# Patient Record
Sex: Female | Born: 1965 | Race: Black or African American | Hispanic: No | Marital: Married | Smoking: Never smoker
Health system: Southern US, Community
[De-identification: ages and names within clinical notes are randomized; demographics above are authoritative.]

## PROBLEM LIST (undated history)

## (undated) DIAGNOSIS — F419 Anxiety disorder, unspecified: Secondary | ICD-10-CM

## (undated) DIAGNOSIS — H409 Unspecified glaucoma: Secondary | ICD-10-CM

## (undated) DIAGNOSIS — J45909 Unspecified asthma, uncomplicated: Secondary | ICD-10-CM

## (undated) DIAGNOSIS — I10 Essential (primary) hypertension: Secondary | ICD-10-CM

## (undated) DIAGNOSIS — Z923 Personal history of irradiation: Secondary | ICD-10-CM

## (undated) DIAGNOSIS — J302 Other seasonal allergic rhinitis: Secondary | ICD-10-CM

## (undated) DIAGNOSIS — E785 Hyperlipidemia, unspecified: Secondary | ICD-10-CM

## (undated) DIAGNOSIS — E049 Nontoxic goiter, unspecified: Secondary | ICD-10-CM

## (undated) DIAGNOSIS — K219 Gastro-esophageal reflux disease without esophagitis: Secondary | ICD-10-CM

## (undated) DIAGNOSIS — D649 Anemia, unspecified: Secondary | ICD-10-CM

## (undated) HISTORY — DX: Gastro-esophageal reflux disease without esophagitis: K21.9

## (undated) HISTORY — DX: Unspecified asthma, uncomplicated: J45.909

## (undated) HISTORY — DX: Nontoxic goiter, unspecified: E04.9

## (undated) HISTORY — DX: Other seasonal allergic rhinitis: J30.2

## (undated) HISTORY — DX: Unspecified glaucoma: H40.9

## (undated) HISTORY — DX: Hyperlipidemia, unspecified: E78.5

## (undated) HISTORY — DX: Essential (primary) hypertension: I10

---

## 1999-04-16 HISTORY — PX: MENISCUS REPAIR: SHX5179

## 2010-04-15 HISTORY — PX: LAPAROSCOPIC CHOLECYSTECTOMY: SUR755

## 2020-06-05 DIAGNOSIS — M25511 Pain in right shoulder: Secondary | ICD-10-CM | POA: Diagnosis not present

## 2020-06-28 DIAGNOSIS — J4540 Moderate persistent asthma, uncomplicated: Secondary | ICD-10-CM | POA: Diagnosis not present

## 2020-06-28 DIAGNOSIS — E785 Hyperlipidemia, unspecified: Secondary | ICD-10-CM | POA: Diagnosis not present

## 2020-06-28 DIAGNOSIS — Z0000 Encounter for general adult medical examination without abnormal findings: Secondary | ICD-10-CM | POA: Diagnosis not present

## 2020-06-28 DIAGNOSIS — R718 Other abnormality of red blood cells: Secondary | ICD-10-CM | POA: Diagnosis not present

## 2020-06-28 DIAGNOSIS — E049 Nontoxic goiter, unspecified: Secondary | ICD-10-CM | POA: Diagnosis not present

## 2020-06-28 DIAGNOSIS — I10 Essential (primary) hypertension: Secondary | ICD-10-CM | POA: Diagnosis not present

## 2020-07-18 ENCOUNTER — Ambulatory Visit: Payer: Self-pay | Admitting: Cardiology

## 2020-08-18 ENCOUNTER — Ambulatory Visit: Payer: Self-pay | Admitting: Cardiology

## 2020-08-30 DIAGNOSIS — M25511 Pain in right shoulder: Secondary | ICD-10-CM | POA: Diagnosis not present

## 2020-09-04 NOTE — Progress Notes (Signed)
Cardiology Office Note   Date:  09/05/2020   ID:  Alexandra Archer, DOB [birth year], MRN [id number]  PCP:  Alexandra Roles, PA  Cardiologist:   None Referring:  Alexandra Roles, PA  Chief Complaint  Patient presents with  . Chest Pain      History of Present Illness: Alexandra Archer is a 55 y.o. female who presents for evaluation of shortness of breath.   She has a strong family history of CAD.  She has no prior cardiac history or work-up.  She does get short of breath.  She notices this after climbing a flight of stairs.  She can keep going but she thinks she is more short of breath than she should be and this is been slowly progressive.  She is not describing chest pressure, neck or arm discomfort.  She is not having any resting shortness of breath, PND or orthopnea.  She has no palpitations, presyncope or syncope.  Of note when she was living in [place] she was diagnosed with pleuritic pain and has had several bouts of the years managed with nonsteroidals.   Past Medical History:  Diagnosis Date  . Asthma   . GERD (gastroesophageal reflux disease)   . Glaucoma   . Goiter   . Hyperlipidemia   . Primary hypertension   . Seasonal allergic rhinitis     Past Surgical History:  Procedure Laterality Date  . CESAREAN SECTION  1994  . LAPAROSCOPIC CHOLECYSTECTOMY  2012  . MENISCUS REPAIR Right 2001     Current Outpatient Medications  Medication Sig Dispense Refill  . albuterol (VENTOLIN HFA) 108 (90 Base) MCG/ACT inhaler Inhale into the lungs every 4 (four) hours as needed for wheezing or shortness of breath.    Marland Kitchen amLODipine (NORVASC) 5 MG tablet Take 5 mg by mouth 2 (two) times daily. Take 1 tablet orally twice a day    . Ascorbic Acid (VITAMIN C) 100 MG tablet Take 100 mg by mouth daily.    . Cholecalciferol (VITAMIN D3) 50 MCG (2000 UT) CAPS Take by mouth.    . Cyanocobalamin (B-12) 100 MCG TABS Take by mouth.    . latanoprost (XALATAN) 0.005 % ophthalmic solution 1  drop at bedtime.    Marland Kitchen omeprazole (PRILOSEC) 40 MG capsule Take 40 mg by mouth daily.     No current facility-administered medications for this visit.    Allergies:   Patient has no known allergies.    Social History:  The patient  reports that she has never smoked. She has never used smokeless tobacco.   Her husband recently had a heart transplant at [facility] History:  The patient's family history includes Esophageal cancer in her mother; Heart attack (age of onset: 30) in her brother; Heart attack (age of onset: 88) in her father; Stomach cancer in her father.    ROS:  Please see the history of present illness.   Otherwise, review of systems are positive for none.   All other systems are reviewed and negative.    PHYSICAL EXAM: VS:  BP 124/80 (BP Location: Right Arm, Patient Position: Sitting, Cuff Size: Normal)   Pulse 70   Ht 5\' 7"  (1.702 m)   Wt 156 lb 12.8 oz (71.1 kg)   BMI 24.56 kg/m  , BMI Body mass index is 24.56 kg/m. GENERAL:  Well appearing HEENT:  Pupils equal round and reactive, fundi not visualized, oral mucosa unremarkable NECK:  No jugular venous distention, waveform within normal  limits, carotid upstroke brisk and symmetric, no bruits, no thyromegaly LYMPHATICS:  No cervical, inguinal adenopathy LUNGS:  Clear to auscultation bilaterally BACK:  No CVA tenderness CHEST:  Unremarkable HEART:  PMI not displaced or sustained,S1 and S2 within normal limits, no S3, no S4, no clicks, no rubs, very brief apical systolic murmur nonradiating, no diastolic murmurs ABD:  Flat, positive bowel sounds normal in frequency in pitch, no bruits, no rebound, no guarding, no midline pulsatile mass, no hepatomegaly, no splenomegaly EXT:  2 plus pulses throughout, no edema, no cyanosis no clubbing SKIN:  No rashes no nodules NEURO:  Cranial nerves II through XII grossly intact, motor grossly intact throughout PSYCH:  Cognitively intact, oriented to person place and time    EKG:   EKG is ordered today. The ekg ordered today demonstrates normal sinus rhythm, rate 70, axis within normal limits, intervals within normal limits, no acute ST-T wave changes.   Recent Labs: No results found for requested labs within last 8760 hours.    Lipid Panel No results found for: CHOL, TRIG, HDL, CHOLHDL, VLDL, LDLCALC, LDLDIRECT    Wt Readings from Last 3 Encounters:  09/05/20 156 lb 12.8 oz (71.1 kg)      Other studies Reviewed: Additional studies/ records that were reviewed today include: labs. Review of the above records demonstrates:  Please see elsewhere in the note.     ASSESSMENT AND PLAN:  SOB: The patient has a very strong family history of coronary disease and other risk factors including dyslipidemia as below.  I am going to screen her with a POET (Plain Old Exercise Treadmill) and a calcium score.  The calcium score will guide goals of therapy.  HTN: Her blood pressure is controlled.  No change in therapy.  LDL: Her LDL was 177 and total cholesterol 254.  Goals of therapy will be based on calcium score.  We did talk about plant-based diet.   Current medicines are reviewed at length with the patient today.  The patient does not have concerns regarding medicines.  The following changes have been made:  no change  Labs/ tests ordered today include:   Orders Placed This Encounter  Procedures  . CT CARDIAC SCORING (SELF PAY ONLY)  . EXERCISE TOLERANCE TEST (ETT)  . EKG 12-Lead     Disposition:   FU with me as needed and based on the results of the above.     Signed, Minus Breeding, MD  09/05/2020 5:20 PM    [facility]

## 2020-09-05 ENCOUNTER — Other Ambulatory Visit: Payer: Self-pay

## 2020-09-05 ENCOUNTER — Ambulatory Visit (INDEPENDENT_AMBULATORY_CARE_PROVIDER_SITE_OTHER): Payer: BC Managed Care – PPO | Admitting: Cardiology

## 2020-09-05 ENCOUNTER — Encounter: Payer: Self-pay | Admitting: Cardiology

## 2020-09-05 VITALS — BP 124/80 | HR 70 | Ht 67.0 in | Wt 156.8 lb

## 2020-09-05 DIAGNOSIS — R0600 Dyspnea, unspecified: Secondary | ICD-10-CM

## 2020-09-05 DIAGNOSIS — R0609 Other forms of dyspnea: Secondary | ICD-10-CM

## 2020-09-05 NOTE — Patient Instructions (Signed)
Medication Instructions:  No Changes In Medications at this time.  *If you need a refill on your cardiac medications before your next appointment, please call your pharmacy*  Testing/Procedures: Your physician has requested that you have an exercise tolerance test. For further information please visit [email or web address]. Please also follow instruction sheet, as given. This will take place at [address], [address].  Do not drink or eat foods with caffeine for 24 hours before the test. (Chocolate, coffee, tea, or energy drinks)  If you use an inhaler, bring it with you to the test.  Do not smoke for 4 hours before the test.  Wear comfortable shoes and clothing.  Dr. Percival Spanish has ordered a CT coronary calcium score. This test is done at [address]. This is $99 out of pocket.   Coronary CalciumScan A coronary calcium scan is an imaging test used to look for deposits of calcium and other fatty materials (plaques) in the inner lining of the blood vessels of the heart (coronary arteries). These deposits of calcium and plaques can partly clog and narrow the coronary arteries without producing any symptoms or warning signs. This puts a person at risk for a heart attack. This test can detect these deposits before symptoms develop. Tell a health care provider about:  Any allergies you have.  All medicines you are taking, including vitamins, herbs, eye drops, creams, and over-the-counter medicines.  Any problems you or family members have had with anesthetic medicines.  Any blood disorders you have.  Any surgeries you have had.  Any medical conditions you have.  Whether you are pregnant or may be pregnant. What are the risks? Generally, this is a safe procedure. However, problems may occur, including:  Harm to a pregnant woman and her unborn baby. This test involves the use of radiation. Radiation exposure can be dangerous to a pregnant woman and her unborn  baby. If you are pregnant, you generally should not have this procedure done.  Slight increase in the risk of cancer. This is because of the radiation involved in the test. What happens before the procedure? No preparation is needed for this procedure. What happens during the procedure?  You will undress and remove any jewelry around your neck or chest.  You will put on a hospital gown.  Sticky electrodes will be placed on your chest. The electrodes will be connected to an electrocardiogram (ECG) machine to record a tracing of the electrical activity of your heart.  A CT scanner will take pictures of your heart. During this time, you will be asked to lie still and hold your breath for 2-3 seconds while a picture of your heart is being taken. The procedure may vary among health care providers and hospitals. What happens after the procedure?  You can get dressed.  You can return to your normal activities.  It is up to you to get the results of your test. Ask your health care provider, or the department that is doing the test, when your results will be ready. Summary  A coronary calcium scan is an imaging test used to look for deposits of calcium and other fatty materials (plaques) in the inner lining of the blood vessels of the heart (coronary arteries).  Generally, this is a safe procedure. Tell your health care provider if you are pregnant or may be pregnant.  No preparation is needed for this procedure.  A CT scanner will take pictures of your heart.  You can return  to your normal activities after the scan is done. This information is not intended to replace advice given to you by your health care provider. Make sure you discuss any questions you have with your health care provider. Document Released: 09/28/2007 Document Revised: 02/19/2016 Document Reviewed: 02/19/2016 Elsevier Interactive Patient Education  2017 [place]: At [facility], you and your  health needs are our priority.  As part of our continuing mission to provide you with exceptional heart care, we have created designated Provider Care Teams.  These Care Teams include your primary Cardiologist (physician) and Advanced Practice Providers (APPs -  Physician Assistants and Nurse Practitioners) who all work together to provide you with the care you need, when you need it.  We recommend signing up for the patient portal called "MyChart".  Sign up information is provided on this After Visit Summary.  MyChart is used to connect with patients for Virtual Visits (Telemedicine).  Patients are able to view lab/test results, encounter notes, upcoming appointments, etc.  Non-urgent messages can be sent to your provider as well.   To learn more about what you can do with MyChart, go to [email or web address].    Your next appointment:   AS NEEDED   The format for your next appointment:   In Person  Provider:   Minus Breeding, MD

## 2020-09-12 ENCOUNTER — Encounter (HOSPITAL_COMMUNITY): Payer: BC Managed Care – PPO

## 2020-09-13 DIAGNOSIS — H401111 Primary open-angle glaucoma, right eye, mild stage: Secondary | ICD-10-CM | POA: Diagnosis not present

## 2020-09-13 DIAGNOSIS — H2513 Age-related nuclear cataract, bilateral: Secondary | ICD-10-CM | POA: Diagnosis not present

## 2020-09-13 DIAGNOSIS — H401122 Primary open-angle glaucoma, left eye, moderate stage: Secondary | ICD-10-CM | POA: Diagnosis not present

## 2020-09-16 DIAGNOSIS — Z20822 Contact with and (suspected) exposure to covid-19: Secondary | ICD-10-CM | POA: Diagnosis not present

## 2020-09-19 ENCOUNTER — Inpatient Hospital Stay (HOSPITAL_COMMUNITY): Admission: RE | Admit: 2020-09-19 | Payer: BC Managed Care – PPO | Source: Ambulatory Visit

## 2020-09-20 ENCOUNTER — Telehealth (HOSPITAL_COMMUNITY): Payer: Self-pay | Admitting: *Deleted

## 2020-09-20 NOTE — Telephone Encounter (Signed)
Close encounter 

## 2020-09-21 ENCOUNTER — Other Ambulatory Visit: Payer: Self-pay | Admitting: *Deleted

## 2020-09-21 ENCOUNTER — Other Ambulatory Visit: Payer: Self-pay

## 2020-09-21 ENCOUNTER — Ambulatory Visit (HOSPITAL_COMMUNITY)
Admission: RE | Admit: 2020-09-21 | Discharge: 2020-09-21 | Disposition: A | Discharge status: Home / Self Care | Payer: BC Managed Care – PPO | Source: Ambulatory Visit | Attending: Cardiovascular Disease | Admitting: Cardiovascular Disease

## 2020-09-21 DIAGNOSIS — R0600 Dyspnea, unspecified: Secondary | ICD-10-CM

## 2020-09-21 DIAGNOSIS — R0609 Other forms of dyspnea: Secondary | ICD-10-CM

## 2020-09-21 LAB — EXERCISE TOLERANCE TEST
Estimated workload: 7.8 METS
Exercise duration (min): 6 min
Exercise duration (sec): 31 s
MPHR: 167 {beats}/min
Peak HR: 169 {beats}/min
Percent HR: 101 %
Rest HR: 73 {beats}/min

## 2020-10-06 ENCOUNTER — Other Ambulatory Visit: Payer: Self-pay

## 2020-10-06 ENCOUNTER — Ambulatory Visit (INDEPENDENT_AMBULATORY_CARE_PROVIDER_SITE_OTHER)
Admission: RE | Admit: 2020-10-06 | Discharge: 2020-10-06 | Disposition: A | Discharge status: Home / Self Care | Payer: Self-pay | Source: Ambulatory Visit | Attending: Cardiology | Admitting: Cardiology

## 2020-10-06 DIAGNOSIS — R0600 Dyspnea, unspecified: Secondary | ICD-10-CM

## 2020-10-12 ENCOUNTER — Telehealth: Payer: Self-pay | Admitting: *Deleted

## 2020-10-12 DIAGNOSIS — R0609 Other forms of dyspnea: Secondary | ICD-10-CM

## 2020-10-12 NOTE — Telephone Encounter (Addendum)
-----   Message from Minus Breeding, MD sent at 10/11/2020  8:23 AM EDT ----- The calcium score is zero.  She had a negative POET (Plain Old Exercise Treadmill).  To evaluate SOB I would suggest PFTs.  Call Alexandra Archer with the results and send results to [name], [name], [place]   Spoke with pt, aware of the recommendations. Order placed for PFT's.

## 2020-11-01 DIAGNOSIS — R509 Fever, unspecified: Secondary | ICD-10-CM | POA: Diagnosis not present

## 2020-11-01 DIAGNOSIS — Z03818 Encounter for observation for suspected exposure to other biological agents ruled out: Secondary | ICD-10-CM | POA: Diagnosis not present

## 2020-11-01 DIAGNOSIS — R0982 Postnasal drip: Secondary | ICD-10-CM | POA: Diagnosis not present

## 2020-11-01 DIAGNOSIS — R059 Cough, unspecified: Secondary | ICD-10-CM | POA: Diagnosis not present

## 2020-11-17 ENCOUNTER — Encounter (HOSPITAL_COMMUNITY): Payer: BC Managed Care – PPO

## 2020-12-25 DIAGNOSIS — I10 Essential (primary) hypertension: Secondary | ICD-10-CM | POA: Diagnosis not present

## 2020-12-25 DIAGNOSIS — E785 Hyperlipidemia, unspecified: Secondary | ICD-10-CM | POA: Diagnosis not present

## 2020-12-25 DIAGNOSIS — H409 Unspecified glaucoma: Secondary | ICD-10-CM | POA: Diagnosis not present

## 2020-12-25 DIAGNOSIS — E538 Deficiency of other specified B group vitamins: Secondary | ICD-10-CM | POA: Diagnosis not present

## 2020-12-26 ENCOUNTER — Other Ambulatory Visit: Payer: Self-pay | Admitting: Cardiology

## 2020-12-26 LAB — SARS CORONAVIRUS 2 (TAT 6-24 HRS): SARS Coronavirus 2: NEGATIVE

## 2020-12-29 ENCOUNTER — Ambulatory Visit (HOSPITAL_COMMUNITY)
Admission: RE | Admit: 2020-12-29 | Discharge: 2020-12-29 | Disposition: A | Discharge status: Home / Self Care | Payer: BC Managed Care – PPO | Source: Ambulatory Visit | Attending: Cardiology | Admitting: Cardiology

## 2020-12-29 ENCOUNTER — Other Ambulatory Visit: Payer: Self-pay

## 2020-12-29 DIAGNOSIS — R0600 Dyspnea, unspecified: Secondary | ICD-10-CM | POA: Diagnosis not present

## 2020-12-29 DIAGNOSIS — R0609 Other forms of dyspnea: Secondary | ICD-10-CM

## 2020-12-29 LAB — PULMONARY FUNCTION TEST
DL/VA % pred: 137 %
DL/VA: 5.76 ml/min/mmHg/L
DLCO unc % pred: 82 %
DLCO unc: 18.87 ml/min/mmHg
FEF 25-75 Post: 1.89 L/sec
FEF 25-75 Pre: 1.56 L/sec
FEF2575-%Change-Post: 20 %
FEF2575-%Pred-Post: 73 %
FEF2575-%Pred-Pre: 61 %
FEV1-%Change-Post: 22 %
FEV1-%Pred-Post: 77 %
FEV1-%Pred-Pre: 63 %
FEV1-Post: 1.97 L
FEV1-Pre: 1.61 L
FEV1FVC-%Change-Post: 24 %
FEV1FVC-%Pred-Pre: 81 %
FEV6-%Change-Post: -1 %
FEV6-%Pred-Post: 77 %
FEV6-%Pred-Pre: 78 %
FEV6-Post: 2.4 L
FEV6-Pre: 2.44 L
FEV6FVC-%Pred-Post: 102 %
FEV6FVC-%Pred-Pre: 102 %
FVC-%Change-Post: -1 %
FVC-%Pred-Post: 75 %
FVC-%Pred-Pre: 76 %
FVC-Post: 2.4 L
FVC-Pre: 2.44 L
Post FEV1/FVC ratio: 82 %
Post FEV6/FVC ratio: 100 %
Pre FEV1/FVC ratio: 66 %
Pre FEV6/FVC Ratio: 100 %
RV % pred: 81 %
RV: 1.63 L
TLC % pred: 73 %
TLC: 4.06 L

## 2020-12-29 MED ORDER — ALBUTEROL SULFATE (2.5 MG/3ML) 0.083% IN NEBU
2.5000 mg | INHALATION_SOLUTION | Freq: Once | RESPIRATORY_TRACT | Status: AC
  Administered 2020-12-29: 2.5 mg via RESPIRATORY_TRACT

## 2021-01-01 ENCOUNTER — Other Ambulatory Visit: Payer: Self-pay

## 2021-01-01 DIAGNOSIS — R0600 Dyspnea, unspecified: Secondary | ICD-10-CM

## 2021-01-01 DIAGNOSIS — R0609 Other forms of dyspnea: Secondary | ICD-10-CM

## 2021-01-05 ENCOUNTER — Other Ambulatory Visit: Payer: Self-pay | Admitting: Physician Assistant

## 2021-01-05 DIAGNOSIS — Z1231 Encounter for screening mammogram for malignant neoplasm of breast: Secondary | ICD-10-CM

## 2021-01-25 ENCOUNTER — Ambulatory Visit
Admission: RE | Admit: 2021-01-25 | Discharge: 2021-01-25 | Disposition: A | Discharge status: Home / Self Care | Payer: BC Managed Care – PPO | Source: Ambulatory Visit

## 2021-01-25 ENCOUNTER — Other Ambulatory Visit: Payer: Self-pay

## 2021-01-25 DIAGNOSIS — Z1231 Encounter for screening mammogram for malignant neoplasm of breast: Secondary | ICD-10-CM

## 2021-01-29 ENCOUNTER — Telehealth: Payer: Self-pay | Admitting: Cardiology

## 2021-01-29 NOTE — Telephone Encounter (Signed)
Pt called stating she didn't fully understanding the results of her PFT test and was concern. Nurse explained results and advised MD wanted to refer her to pulmonologist for further evaluations. Pt verbalized understanding and state she already has an appointment schedule.

## 2021-01-29 NOTE — Telephone Encounter (Signed)
Patient calling to discuss test results

## 2021-02-01 ENCOUNTER — Other Ambulatory Visit: Payer: Self-pay | Admitting: Physician Assistant

## 2021-02-01 DIAGNOSIS — R928 Other abnormal and inconclusive findings on diagnostic imaging of breast: Secondary | ICD-10-CM

## 2021-02-01 DIAGNOSIS — M25511 Pain in right shoulder: Secondary | ICD-10-CM | POA: Diagnosis not present

## 2021-02-02 ENCOUNTER — Institutional Professional Consult (permissible substitution): Payer: Commercial Managed Care - PPO | Admitting: Student

## 2021-02-02 DIAGNOSIS — F419 Anxiety disorder, unspecified: Secondary | ICD-10-CM | POA: Diagnosis not present

## 2021-02-02 DIAGNOSIS — Z23 Encounter for immunization: Secondary | ICD-10-CM | POA: Diagnosis not present

## 2021-02-02 DIAGNOSIS — N951 Menopausal and female climacteric states: Secondary | ICD-10-CM | POA: Diagnosis not present

## 2021-02-14 NOTE — Progress Notes (Signed)
Synopsis: Referred for dyspnea on exertion, abnormal PFTs by Minus Breeding, MD  Subjective:   PATIENT ID: Alexandra Archer GENDER: female DOB: 1966-03-05, MRN: [id number]  Chief Complaint  Patient presents with   Consult    Pft was done in September and was referred. SOB: during exertion   54yF with history of asthma - never had it or eczema as a kid, AR but no sinus surgery, GERD who is referred for PFTs showing mild obstructive lung disease with remarkable BD response. FV loops raise possibility as well of upper airway obstruction which did seem less prominent with BD.  She says that she wakes up coughing all the time - not productive. Walking she has some DOE. Notices it after walking about a block. Some wheezing. Notices it sometimes when she's talking. She has some hoarseness which she relates to spraying lysol at office. No accompanying throat tightness when episodes happen. Saw an ENT in [place] for sinus issues many years ago - didn't do FNL exam she doesn't think.   She doesn't have sinonasal congestion, rhinorrhea. A little bit of post-nasal drip. She has no reflux currently on her omeprazole 40 mg which she takes first thing in the morning before eating.   Got a steroid shot for her shoulder recently and then prednisone course but she noticed maybe only a limited improvement.  Got covid-19 in 10/2020 - mild course. Not hospitalized. Took something specifically for covid.   She has tried following inhalers: Albuterol Arnuity - rinsed mouth after use. She noticed no effect.     Otherwise pertinent review of systems is negative.  Grandfather emphysema and aunt had COPD. Mother had esophageal cancer.  Just recently moved from [place] - was in [place]. [place] in [place]. Has lived previously in [place], [place]. No recent travel. 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 No exposure to dusts/solvents/particulates at home or at work. No water damage. She has a yorkie. Had a  cockatiel, got rid of it 4 ya she is unsure if she had some breathing issues back then. Never smoked. No vaping.  Past Medical History:  Diagnosis Date   Asthma    GERD (gastroesophageal reflux disease)    Glaucoma    Goiter    Hyperlipidemia    Primary hypertension    Seasonal allergic rhinitis      Family History  Problem Relation Age of Onset   Esophageal cancer Mother    Stomach cancer Father    Heart attack Father 26   Heart attack Brother 63     Past Surgical History:  Procedure Laterality Date   CESAREAN SECTION  1994   LAPAROSCOPIC CHOLECYSTECTOMY  2012   MENISCUS REPAIR Right 2001    Social History   Socioeconomic History   Marital status: Married    Spouse name: Not on file   Number of children: Not on file   Years of education: Not on file   Highest education level: Not on file  Occupational History   Not on file  Tobacco Use   Smoking status: Never   Smokeless tobacco: Never  Substance and Sexual Activity   Alcohol use: Not on file   Drug use: Not on file   Sexual activity: Not on file  Other Topics Concern   Not on file  Social History Narrative   Lives at home with husband.     Social Determinants of Health   Financial Resource Strain: Not on file  Food Insecurity:  Not on file  Transportation Needs: Not on file  Physical Activity: Not on file  Stress: Not on file  Social Connections: Not on file  Intimate Partner Violence: Not on file     No Known Allergies   Outpatient Medications Prior to Visit  Medication Sig Dispense Refill   albuterol (VENTOLIN HFA) 108 (90 Base) MCG/ACT inhaler Inhale into the lungs every 4 (four) hours as needed for wheezing or shortness of breath.     amLODipine (NORVASC) 5 MG tablet Take 5 mg by mouth 2 (two) times daily. Take 1 tablet orally twice a day     Ascorbic Acid (VITAMIN C) 100 MG tablet Take 100 mg by mouth daily.     Cholecalciferol (VITAMIN D3) 50 MCG (2000 UT) CAPS Take by mouth.      Cyanocobalamin (B-12) 100 MCG TABS Take by mouth.     latanoprost (XALATAN) 0.005 % ophthalmic solution 1 drop at bedtime.     omeprazole (PRILOSEC) 40 MG capsule Take 40 mg by mouth daily.     No facility-administered medications prior to visit.       Objective:   Physical Exam:  General appearance: 55 y.o., female, NAD, conversant  Eyes: anicteric sclerae, moist conjunctivae; no lid-lag; PERRL, tracking appropriately HENT: NCAT; +inferior turbinate hypertrophy/edema R>>L Neck: Trachea midline; no lymphadenopathy, no JVD Lungs: Mostly inspiratory wheeze bilaterally L>R, no crackles, no wheeze, with normal respiratory effort CV: RRR, no MRGs  Abdomen: Soft, non-tender; non-distended, BS present  Extremities: No peripheral edema, radial and DP pulses present bilaterally  Skin: Normal temperature, turgor and texture; no rash Psych: Appropriate affect Neuro: Alert and oriented to person and place, no focal deficit    Vitals:   02/15/21 0957  BP: 132/78  Pulse: 72  Temp: 98 F (36.7 C)  TempSrc: Oral  SpO2: 100%  Weight: 164 lb 3.2 oz (74.5 kg)  Height: 5\' 7"  (1.702 m)   100% on RA BMI Readings from Last 3 Encounters:  02/15/21 25.72 kg/m  09/05/20 24.56 kg/m   Wt Readings from Last 3 Encounters:  02/15/21 164 lb 3.2 oz (74.5 kg)  09/05/20 156 lb 12.8 oz (71.1 kg)     CBC No results found for: WBC, RBC, HGB, HCT, PLT, MCV, MCH, MCHC, RDW, LYMPHSABS, MONOABS, EOSABS, BASOSABS  Low level eosinophilia in past  Chest Imaging:  CT Cardiac scoring 10/06/20 reviewed by me remarkable for mosaic attenuation in this expiratory scan  Pulmonary Functions Testing Results: PFT Results Latest Ref Rng & Units 12/29/2020  FVC-Pre L 2.44  FVC-Predicted Pre % 76  FVC-Post L 2.40  FVC-Predicted Post % 75  Pre FEV1/FVC % % 66  Post FEV1/FCV % % 82  FEV1-Pre L 1.61  FEV1-Predicted Pre % 63  FEV1-Post L 1.97  DLCO uncorrected ml/min/mmHg 18.87  DLCO UNC% % 82  DLVA  Predicted % 137  TLC L 4.06  TLC % Predicted % 73  RV % Predicted % 81   EKG Stress 09/21/20: There was no ST segment deviation noted during stress.   Exercise time: 6:31 min Workload: 7.8 METS, average exercise capacity Test stopped due to: fatigue, SOB BP response: elevated without hypertensive response to exercise, 203/93 mmHg. HR response: normal Rhythm: SR with occasional PVCs in recovery   No ischemia on treadmill stress ECG.         Assessment & Plan:   # Uncontrolled persistent asthma: Only triggers she identifies are irritants (cleaning agents) and exertion.  # Hoarseness: # Possible upper  airway obstruction: FV loops suggestive of upper airway obstruction with consistent flattening of inspiratory and expiratory curve - did however improve somewhat with bronchodilator.  Plan: - cbc/diff, IgE - ENT referral for flexible nasolaryngoscopic exam given repeatable abnormal flow volume loops suggestive of upper airway obstruction - symbicort 160 2 puffs BID - albuterol prn - restart flonase  - continue omeprazole  RTC 3 months   Maryjane Hurter, MD [facility] 02/15/2021 10:03 AM

## 2021-02-15 ENCOUNTER — Ambulatory Visit (INDEPENDENT_AMBULATORY_CARE_PROVIDER_SITE_OTHER): Payer: Commercial Managed Care - PPO | Admitting: Student

## 2021-02-15 ENCOUNTER — Encounter: Payer: Self-pay | Admitting: Student

## 2021-02-15 ENCOUNTER — Other Ambulatory Visit: Payer: Self-pay

## 2021-02-15 VITALS — BP 132/78 | HR 72 | Temp 98.0°F | Ht 67.0 in | Wt 164.2 lb

## 2021-02-15 DIAGNOSIS — R490 Dysphonia: Secondary | ICD-10-CM

## 2021-02-15 DIAGNOSIS — J45998 Other asthma: Secondary | ICD-10-CM

## 2021-02-15 LAB — CBC WITH DIFFERENTIAL/PLATELET
Basophils Absolute: 0 10*3/uL (ref 0.0–0.1)
Basophils Relative: 0.2 % (ref 0.0–3.0)
Eosinophils Absolute: 0 10*3/uL (ref 0.0–0.7)
Eosinophils Relative: 0.2 % (ref 0.0–5.0)
HCT: 42.9 % (ref 36.0–46.0)
Hemoglobin: 13.4 g/dL (ref 12.0–15.0)
Lymphocytes Relative: 16.3 % (ref 12.0–46.0)
Lymphs Abs: 1.3 10*3/uL (ref 0.7–4.0)
MCHC: 31.3 g/dL (ref 30.0–36.0)
MCV: 78 fl (ref 78.0–100.0)
Monocytes Absolute: 0.6 10*3/uL (ref 0.1–1.0)
Monocytes Relative: 8.2 % (ref 3.0–12.0)
Neutro Abs: 5.9 10*3/uL (ref 1.4–7.7)
Neutrophils Relative %: 75.1 % (ref 43.0–77.0)
Platelets: 215 10*3/uL (ref 150.0–400.0)
RBC: 5.5 Mil/uL — ABNORMAL HIGH (ref 3.87–5.11)
RDW: 15.2 % (ref 11.5–15.5)
WBC: 7.8 10*3/uL (ref 4.0–10.5)

## 2021-02-15 MED ORDER — FLUTICASONE PROPIONATE 50 MCG/ACT NA SUSP: 1.0000 | Freq: Every day | NASAL | 2 refills | Status: AC

## 2021-02-15 MED ORDER — BUDESONIDE-FORMOTEROL FUMARATE 80-4.5 MCG/ACT IN AERO: 2.0000 | INHALATION_SPRAY | Freq: Two times a day (BID) | RESPIRATORY_TRACT | 12 refills | Status: DC

## 2021-02-15 NOTE — Patient Instructions (Signed)
-   flonase spray just off center toward your eyeball - symbicort 2 puffs twice daily rinse your mouth out afterward - let us know if it's too expensive and we'll try an alternative. You can also call your insurer to find out which LABA/ICS is best covered

## 2021-02-16 ENCOUNTER — Other Ambulatory Visit: Payer: Self-pay | Admitting: Physician Assistant

## 2021-02-16 ENCOUNTER — Ambulatory Visit
Admission: RE | Admit: 2021-02-16 | Discharge: 2021-02-16 | Disposition: A | Discharge status: Home / Self Care | Payer: BC Managed Care – PPO | Source: Ambulatory Visit | Attending: Physician Assistant | Admitting: Physician Assistant

## 2021-02-16 DIAGNOSIS — R921 Mammographic calcification found on diagnostic imaging of breast: Secondary | ICD-10-CM | POA: Diagnosis not present

## 2021-02-16 DIAGNOSIS — R928 Other abnormal and inconclusive findings on diagnostic imaging of breast: Secondary | ICD-10-CM

## 2021-02-16 DIAGNOSIS — R922 Inconclusive mammogram: Secondary | ICD-10-CM | POA: Diagnosis not present

## 2021-02-16 LAB — IGE: IgE (Immunoglobulin E), Serum: 6 kU/L (ref <=114)

## 2021-02-19 DIAGNOSIS — F419 Anxiety disorder, unspecified: Secondary | ICD-10-CM | POA: Diagnosis not present

## 2021-02-23 ENCOUNTER — Other Ambulatory Visit: Payer: Self-pay

## 2021-02-23 ENCOUNTER — Ambulatory Visit
Admission: RE | Admit: 2021-02-23 | Discharge: 2021-02-23 | Disposition: A | Discharge status: Home / Self Care | Payer: BC Managed Care – PPO | Source: Ambulatory Visit | Attending: Physician Assistant | Admitting: Physician Assistant

## 2021-02-23 DIAGNOSIS — C50919 Malignant neoplasm of unspecified site of unspecified female breast: Secondary | ICD-10-CM

## 2021-02-23 DIAGNOSIS — D0511 Intraductal carcinoma in situ of right breast: Secondary | ICD-10-CM | POA: Diagnosis not present

## 2021-02-23 DIAGNOSIS — R921 Mammographic calcification found on diagnostic imaging of breast: Secondary | ICD-10-CM | POA: Diagnosis not present

## 2021-02-23 HISTORY — DX: Malignant neoplasm of unspecified site of unspecified female breast: C50.919

## 2021-03-01 HISTORY — PX: BREAST BIOPSY: SHX20

## 2021-03-05 ENCOUNTER — Other Ambulatory Visit: Payer: Self-pay | Admitting: General Surgery

## 2021-03-05 DIAGNOSIS — C50411 Malignant neoplasm of upper-outer quadrant of right female breast: Secondary | ICD-10-CM

## 2021-03-05 DIAGNOSIS — Z809 Family history of malignant neoplasm, unspecified: Secondary | ICD-10-CM | POA: Diagnosis not present

## 2021-03-05 DIAGNOSIS — Z170 Estrogen receptor positive status [ER+]: Secondary | ICD-10-CM

## 2021-03-12 ENCOUNTER — Telehealth: Payer: Self-pay | Admitting: Radiation Oncology

## 2021-03-13 NOTE — Progress Notes (Signed)
New Breast Cancer Diagnosis: Right Breast UOQ  Did patient present with symptoms (if so, please note symptoms) or screening mammography?:Screening Calcifications    Location and Extent of disease :right breast. Diagnostic imaging measured a group of calcifications up to 1.4 cm in the upper outer quadrant of the right breast.  A cyst measuring 7 mm was also seen in the 1:00 position.  Adenopathy no.  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 Barry Dienes -Right Lumpectomy with radioactive seed localization 03/29/2021   Medical oncologist, treatment if any:   Dr. Chryl Heck 04/05/2021   Family History of Breast/Ovarian/Prostate Cancer: Paternal Grandfather had Prostate   Lymphedema issues, if any: No     Pain issues, if any: Has mild discomfort from biopsy.    SAFETY ISSUES: Prior radiation? No Pacemaker/ICD? No Possible current pregnancy? Postmenopausal Is the patient on methotrexate? No  Current Complaints / other details:

## 2021-03-14 ENCOUNTER — Other Ambulatory Visit: Payer: Self-pay

## 2021-03-14 ENCOUNTER — Telehealth: Payer: Self-pay | Admitting: Hematology and Oncology

## 2021-03-14 ENCOUNTER — Ambulatory Visit
Admission: RE | Admit: 2021-03-14 | Discharge: 2021-03-14 | Disposition: A | Discharge status: Home / Self Care | Payer: BC Managed Care – PPO | Source: Ambulatory Visit | Attending: Radiation Oncology | Admitting: Radiation Oncology

## 2021-03-14 ENCOUNTER — Encounter: Payer: Self-pay | Admitting: Radiation Oncology

## 2021-03-14 DIAGNOSIS — D0511 Intraductal carcinoma in situ of right breast: Secondary | ICD-10-CM | POA: Insufficient documentation

## 2021-03-14 DIAGNOSIS — J45909 Unspecified asthma, uncomplicated: Secondary | ICD-10-CM | POA: Insufficient documentation

## 2021-03-14 DIAGNOSIS — Z170 Estrogen receptor positive status [ER+]: Secondary | ICD-10-CM | POA: Insufficient documentation

## 2021-03-14 DIAGNOSIS — N6001 Solitary cyst of right breast: Secondary | ICD-10-CM | POA: Insufficient documentation

## 2021-03-14 DIAGNOSIS — Z79899 Other long term (current) drug therapy: Secondary | ICD-10-CM | POA: Insufficient documentation

## 2021-03-14 DIAGNOSIS — Z800 Family history of malignant neoplasm of digestive organs: Secondary | ICD-10-CM | POA: Diagnosis not present

## 2021-03-14 DIAGNOSIS — E785 Hyperlipidemia, unspecified: Secondary | ICD-10-CM | POA: Insufficient documentation

## 2021-03-14 DIAGNOSIS — K219 Gastro-esophageal reflux disease without esophagitis: Secondary | ICD-10-CM | POA: Diagnosis not present

## 2021-03-14 DIAGNOSIS — I10 Essential (primary) hypertension: Secondary | ICD-10-CM | POA: Insufficient documentation

## 2021-03-14 NOTE — Telephone Encounter (Signed)
Scheduled appt per 11/28 referral. Pt is aware of appt date and time.  

## 2021-03-14 NOTE — Progress Notes (Signed)
Radiation Oncology         [phone]) [phone] ________________________________  Name: Alexandra Archer        MRN: [id number]  Date of Service: 03/14/2021 DOB: [birth year]  PX:[id number], [name], PA  Stark Klein, MD     REFERRING PHYSICIAN: Stark Klein, MD   DIAGNOSIS: The encounter diagnosis was Ductal carcinoma in situ (DCIS) of right breast.   HISTORY OF PRESENT ILLNESS: Alexandra Archer is a 55 y.o. female seen at the request of Dr. Barry Dienes for a new diagnosis of right breast cancer. The patient was found to have a screening detected right breast calcifications and diagnostic imaging measured a group of calcifications up to 1.4 cm in the upper outer quadrant of the right breast. A cyst measuring 7 mm was also seen in the 1:00 position and a stereotactic biopsy on 11/11 showed a high grade DCIS that was ER positive, PR negative. She's planning to undergo right lumpectomy on 03/29/21 and is seen to discuss adjuvant radiotherapy.    PREVIOUS RADIATION THERAPY: No   PAST MEDICAL HISTORY:  Past Medical History:  Diagnosis Date   Asthma    Breast cancer ([place]) 02/23/2021   GERD (gastroesophageal reflux disease)    Glaucoma    Goiter    Hyperlipidemia    Primary hypertension    Seasonal allergic rhinitis        PAST SURGICAL HISTORY: Past Surgical History:  Procedure Laterality Date   CESAREAN SECTION  1994   LAPAROSCOPIC CHOLECYSTECTOMY  2012   MENISCUS REPAIR Right 2001     FAMILY HISTORY:  Family History  Problem Relation Age of Onset   Esophageal cancer Mother    Stomach cancer Father    Heart attack Father 29   Heart attack Brother 60   Prostate cancer Paternal Grandfather      SOCIAL HISTORY:  reports that she has never smoked. She has never been exposed to tobacco smoke. She has never used smokeless tobacco. She reports current alcohol use. The patient is married and lives in [place]. Her husband  underwent heart transplant earlier this year and their first  grandchild is due to be born in early January. They plan to travel to [place] to see the baby.    ALLERGIES: Patient has no known allergies.   MEDICATIONS:  Current Outpatient Medications  Medication Sig Dispense Refill   albuterol (VENTOLIN HFA) 108 (90 Base) MCG/ACT inhaler Inhale into the lungs every 4 (four) hours as needed for wheezing or shortness of breath.     ALPRAZolam (XANAX) 0.25 MG tablet Take 0.25 mg by mouth daily as needed.     amLODipine (NORVASC) 5 MG tablet Take 5 mg by mouth 2 (two) times daily. Take 1 tablet orally twice a day     Ascorbic Acid (VITAMIN C) 100 MG tablet Take 100 mg by mouth daily.     budesonide-formoterol (SYMBICORT) 80-4.5 MCG/ACT inhaler Inhale 2 puffs into the lungs in the morning and at bedtime. 1 each 12   Cholecalciferol (VITAMIN D3) 50 MCG (2000 UT) CAPS Take by mouth.     citalopram (CELEXA) 20 MG tablet Take 20 mg by mouth daily.     Cyanocobalamin (B-12) 100 MCG TABS Take by mouth.     fluticasone (FLONASE) 50 MCG/ACT nasal spray Place 1 spray into both nostrils daily. 16 g 2   latanoprost (XALATAN) 0.005 % ophthalmic solution 1 drop at bedtime.     omeprazole (PRILOSEC) 40 MG capsule Take 40 mg by  mouth daily.     traZODone (DESYREL) 50 MG tablet Take 50 mg by mouth at bedtime.     No current facility-administered medications for this encounter.     REVIEW OF SYSTEMS: On review of systems, the patient reports that she is doing well in navigating her new diagnosis. She has had some discomfort since her biopsy. No other complaints are verbalized.      PHYSICAL EXAM:  Wt Readings from Last 3 Encounters:  03/14/21 160 lb 3.2 oz (72.7 kg)  02/15/21 164 lb 3.2 oz (74.5 kg)  09/05/20 156 lb 12.8 oz (71.1 kg)   Temp Readings from Last 3 Encounters:  03/14/21 97.8 F (36.6 C)  02/15/21 98 F (36.7 C) (Oral)   BP Readings from Last 3 Encounters:  03/14/21 126/71  02/15/21 132/78  09/05/20 124/80   Pulse Readings from Last 3  Encounters:  03/14/21 75  02/15/21 72  09/05/20 70    In general this is a well appearing African American female in no acute distress. She's alert and oriented x4 and appropriate throughout the examination. Cardiopulmonary assessment is negative for acute distress and she exhibits normal effort. Bilateral breast exam is deferred.    ECOG = 0  0 - Asymptomatic (Fully active, able to carry on all predisease activities without restriction)  1 - Symptomatic but completely ambulatory (Restricted in physically strenuous activity but ambulatory and able to carry out work of a light or sedentary nature. For example, light housework, office work)  2 - Symptomatic, <50% in bed during the day (Ambulatory and capable of all self care but unable to carry out any work activities. Up and about more than 50% of waking hours)  3 - Symptomatic, >50% in bed, but not bedbound (Capable of only limited self-care, confined to bed or chair 50% or more of waking hours)  4 - Bedbound (Completely disabled. Cannot carry on any self-care. Totally confined to bed or chair)  5 - Death   Alexandra Archer MM, Creech RH, Tormey DC, et al. [phone]). "Toxicity and response criteria of the [facility]". [place]. 5 (6): 649-55    LABORATORY DATA:  Lab Results  Component Value Date   WBC 7.8 02/15/2021   HGB 13.4 02/15/2021   HCT 42.9 02/15/2021   MCV 78.0 02/15/2021   PLT 215.0 02/15/2021   No results found for: NA, K, CL, CO2 No results found for: ALT, AST, GGT, ALKPHOS, BILITOT    RADIOGRAPHY: US BREAST LTD UNI RIGHT INC AXILLA  Result Date: 02/16/2021 CLINICAL DATA:  Patient recalled from screening for right breast calcifications and possible mass. EXAM: DIGITAL DIAGNOSTIC UNILATERAL RIGHT MAMMOGRAM WITH TOMOSYNTHESIS AND CAD; ULTRASOUND RIGHT BREAST LIMITED TECHNIQUE: Right digital diagnostic mammography and breast tomosynthesis was performed. The images were evaluated with  computer-aided detection.; Targeted ultrasound examination of the right breast was performed COMPARISON:  Previous exam(s). ACR Breast Density Category c: The breast tissue is heterogeneously dense, which may obscure small masses. FINDINGS: Within the posterolateral right breast there is a 1.4 cm group of coarse heterogeneous calcifications. These are not able to be completely imaged on magnification views given location and difficulty with patient positioning. There is a persistent small lobular mass within the upper inner right breast. On physical exam, no discrete mass palpated upper inner right breast. Targeted ultrasound is performed, showing a 7 x 3 x 5 mm cyst right breast 1 o'clock position 4 cm from nipple. IMPRESSION: Indeterminate right breast calcifications. Right breast cyst.  RECOMMENDATION: Stereotactic guided core needle biopsy indeterminate right breast calcifications. I have discussed the findings and recommendations with the patient. If applicable, a reminder letter will be sent to the patient regarding the next appointment. BI-RADS CATEGORY  4: Suspicious. Electronically Signed   By: Lovey Newcomer M.D.   On: 02/16/2021 12:28  MM DIAG BREAST TOMO UNI RIGHT  Result Date: 02/16/2021 CLINICAL DATA:  Patient recalled from screening for right breast calcifications and possible mass. EXAM: DIGITAL DIAGNOSTIC UNILATERAL RIGHT MAMMOGRAM WITH TOMOSYNTHESIS AND CAD; ULTRASOUND RIGHT BREAST LIMITED TECHNIQUE: Right digital diagnostic mammography and breast tomosynthesis was performed. The images were evaluated with computer-aided detection.; Targeted ultrasound examination of the right breast was performed COMPARISON:  Previous exam(s). ACR Breast Density Category c: The breast tissue is heterogeneously dense, which may obscure small masses. FINDINGS: Within the posterolateral right breast there is a 1.4 cm group of coarse heterogeneous calcifications. These are not able to be completely imaged on  magnification views given location and difficulty with patient positioning. There is a persistent small lobular mass within the upper inner right breast. On physical exam, no discrete mass palpated upper inner right breast. Targeted ultrasound is performed, showing a 7 x 3 x 5 mm cyst right breast 1 o'clock position 4 cm from nipple. IMPRESSION: Indeterminate right breast calcifications. Right breast cyst. RECOMMENDATION: Stereotactic guided core needle biopsy indeterminate right breast calcifications. I have discussed the findings and recommendations with the patient. If applicable, a reminder letter will be sent to the patient regarding the next appointment. BI-RADS CATEGORY  4: Suspicious. Electronically Signed   By: Lovey Newcomer M.D.   On: 02/16/2021 12:28  MM CLIP PLACEMENT RIGHT  Result Date: 02/23/2021 CLINICAL DATA:  Patient status post stereotactic guided core needle biopsy right breast calcifications. EXAM: 3D DIAGNOSTIC RIGHT MAMMOGRAM POST STEREOTACTIC BIOPSY COMPARISON:  Previous exam(s). FINDINGS: 3D Mammographic images were obtained following stereotactic guided biopsy of right breast calcifications. The biopsy marking clip is in expected position at the site of biopsy. Note given the posterior location of the calcifications, the clip is not able to be visualized on the CC view. IMPRESSION: Appropriate positioning of the X shaped biopsy marking clip at the site of biopsy in the upper outer right breast. Final Assessment: Post Procedure Mammograms for Marker Placement Electronically Signed   By: Lovey Newcomer M.D.   On: 02/23/2021 12:17  MM RT BREAST BX W LOC DEV 1ST LESION IMAGE BX SPEC STEREO GUIDE  Addendum Date: 03/01/2021   ADDENDUM REPORT: 03/01/2021 08:13 ADDENDUM: Pathology revealed HIGH GRADE DUCTAL CARCINOMA IN SITU WITH CALCIFICATIONS, FIBROADENOMA of RIGHT breast, upper outer, (x clip). This was found to be concordant by Dr. Lovey Newcomer. Pathology results were discussed with the  patient by telephone. The patient reported doing well after the biopsy with tenderness at the site. Post biopsy instructions and care were reviewed and questions were answered. The patient was encouraged to call [facility] for any additional concerns. My direct phone number was provided. Surgical consultation has been arranged with Dr. Stark Klein, [facility] location, at [facility] on March 05, 2021. RECOMMEND bilateral breast MRI for further evaluation of extent of disease given the high grade histology and far posterior location on mammography. Pathology results reported by Terie Purser, RN on 02/28/2021. Electronically Signed   By: Lovey Newcomer M.D.   On: 03/01/2021 08:13   Result Date: 03/01/2021 CLINICAL DATA:  Patient with indeterminate calcifications upper-outer right breast. EXAM: RIGHT BREAST STEREOTACTIC  CORE NEEDLE BIOPSY COMPARISON:  Previous exams. FINDINGS: The patient and I discussed the procedure of stereotactic-guided biopsy including benefits and alternatives. We discussed the high likelihood of a successful procedure. We discussed the risks of the procedure including infection, bleeding, tissue injury, clip migration, and inadequate sampling. Informed written consent was given. The usual time out protocol was performed immediately prior to the procedure. Using sterile technique and 1% Lidocaine as local anesthetic, under stereotactic guidance, a 9 gauge vacuum assisted device was used to perform core needle biopsy of calcifications upper-outer right breast using a lateral approach. Specimen radiograph was performed showing calcifications. Specimens with calcifications are identified for pathology. Lesion quadrant: Upper outer quadrant At the conclusion of the procedure, X shaped tissue marker clip was deployed into the biopsy cavity. Follow-up 2-view mammogram was performed and dictated separately. IMPRESSION: Stereotactic-guided biopsy of right  breast calcifications. No apparent complications. Electronically Signed: By: Lovey Newcomer M.D. On: 02/23/2021 12:16      IMPRESSION/PLAN: 1. High Grade, ER positive DCIS of the right breast. Dr. Lisbeth Renshaw discusses the pathology findings and reviews the nature of early stage breast disease. Dr. Barry Dienes has recommended lumpectomy of the right breast. Dr. Lisbeth Renshaw recommends external radiotherapy to the breast  to reduce risks of local recurrence followed by antiestrogen therapy. We discussed the risks, benefits, short, and long term effects of radiotherapy, as well as the curative intent, and the patient is interested in proceeding. Dr. Lisbeth Renshaw discusses the delivery and logistics of radiotherapy and anticipates a course of 4 weeks of radiotherapy to the right breast. We will see her back a few weeks after surgery to discuss the simulation process and anticipate we starting radiotherapy about 4-6 weeks after surgery.    In a visit lasting 60 minutes, greater than 50% of the time was spent face to face reviewing her case, as well as in preparation of, discussing, and coordinating the patient's care.  The above documentation reflects my direct findings during this shared patient visit. Please see the separate note by Dr. Lisbeth Renshaw on this date for the remainder of the patient's plan of care.    Carola Rhine, [facility]    **Disclaimer: This note was dictated with voice recognition software. Similar sounding words can inadvertently be transcribed and this note may contain transcription errors which may not have been corrected upon publication of note.**

## 2021-03-16 ENCOUNTER — Encounter: Payer: Self-pay | Admitting: *Deleted

## 2021-03-16 ENCOUNTER — Telehealth: Payer: Self-pay | Admitting: *Deleted

## 2021-03-16 DIAGNOSIS — D0511 Intraductal carcinoma in situ of right breast: Secondary | ICD-10-CM

## 2021-03-16 NOTE — Progress Notes (Signed)
[place] Psychosocial Distress Screening Clinical Social Work  Clinical Social Work was referred by distress screening protocol.  The patient scored a 10 on the Psychosocial Distress Thermometer which indicates severe distress. Clinical Social Worker contacted patient by phone to assess for distress and other psychosocial needs.   CSW explored patient's emotional response to cancer diagnosis- Mrs. Joswick reported feeling "up and down". CSW validated patients feeling as normal response to diagnosis.  CSW encouraged patient to follow up with CSW if she continues to feel "low" once she begins treatment.  Mrs. Girtman shared her daughter plans to visit during surgery.  Patient recently moved to [place] from [place].   ONCBCN DISTRESS SCREENING 03/14/2021  Screening Type Initial Screening  Distress experienced in past week (1-10) 10  Emotional problem type Depression;Nervousness/Anxiety;Adjusting to illness  Other Contact by phone    Clinical Social Worker follow up needed: Yes.    If yes, follow up plan:  CSW made referral to [employer] peer mentor program.  Gwinda Maine, LCSW

## 2021-03-16 NOTE — Telephone Encounter (Signed)
Spoke to pt, provided navigation resources and contact information.  Discussed next steps of sx followed by xrt. Received verbal understanding. Encourage pt to call with needs.  Referral placed for pt to see Dr. Lisbeth Renshaw post op

## 2021-03-19 DIAGNOSIS — B309 Viral conjunctivitis, unspecified: Secondary | ICD-10-CM | POA: Diagnosis not present

## 2021-03-22 ENCOUNTER — Telehealth: Payer: Self-pay | Admitting: Student

## 2021-03-22 NOTE — Telephone Encounter (Signed)
Spoke with the pt  She is c/o increased SOB, wheezing and dry cough for the past 10 days  She feels that symbicort is not working well anymore, and has become expensive but still taking it  She is having breast surgery next wk and concerned about her asthma  She denies fevers or aches and has tested neg for covid 2 days ago  Dr Verlee Monte out of the office tomorrow and AP[facility] are full so I scheduled her to see Dr Loanne Drilling at 11:30 tomorrow

## 2021-03-23 ENCOUNTER — Encounter: Payer: Self-pay | Admitting: Pulmonary Disease

## 2021-03-23 ENCOUNTER — Ambulatory Visit (INDEPENDENT_AMBULATORY_CARE_PROVIDER_SITE_OTHER): Payer: BC Managed Care – PPO | Admitting: Pulmonary Disease

## 2021-03-23 ENCOUNTER — Other Ambulatory Visit: Payer: Self-pay

## 2021-03-23 VITALS — BP 142/86 | HR 83 | Temp 97.9°F | Ht 67.0 in | Wt 162.4 lb

## 2021-03-23 DIAGNOSIS — J45998 Other asthma: Secondary | ICD-10-CM | POA: Diagnosis not present

## 2021-03-23 DIAGNOSIS — J4551 Severe persistent asthma with (acute) exacerbation: Secondary | ICD-10-CM

## 2021-03-23 MED ORDER — FLUTICASONE-SALMETEROL 250-50 MCG/ACT IN AEPB: 1.0000 | INHALATION_SPRAY | Freq: Two times a day (BID) | RESPIRATORY_TRACT | 5 refills | Status: AC

## 2021-03-23 MED ORDER — PREDNISONE 10 MG PO TABS: ORAL_TABLET | ORAL | 0 refills | Status: AC

## 2021-03-23 MED ORDER — IPRATROPIUM BROMIDE 0.06 % NA SOLN: 2.0000 | Freq: Four times a day (QID) | NASAL | 1 refills | Status: AC

## 2021-03-23 NOTE — Progress Notes (Signed)
Synopsis: Referred for dyspnea on exertion, abnormal PFTs by [name], PA  Subjective:   PATIENT ID: Alexandra Archer GENDER: female DOB: 07/21/1965, MRN: [id number]  Chief Complaint  Patient presents with   Follow-up    Patient says she's been coughing since Tuesday.    61yF with history of asthma - never had it or eczema as a kid, AR but no sinus surgery, GERD who is referred for PFTs showing mild obstructive lung disease with remarkable BD response. FV loops raise possibility as well of upper airway obstruction which did seem less prominent with BD.  She says that she wakes up coughing all the time - not productive. Walking she has some DOE. Notices it after walking about a block. Some wheezing. Notices it sometimes when she's talking. She has some hoarseness which she relates to spraying lysol at office. No accompanying throat tightness when episodes happen. Saw an ENT in [place] for sinus issues many years ago - didn't do FNL exam she doesn't think.   She doesn't have sinonasal congestion, rhinorrhea. A little bit of post-nasal drip. She has no reflux currently on her omeprazole 40 mg which she takes first thing in the morning before eating.   Got a steroid shot for her shoulder recently and then prednisone course but she noticed maybe only a limited improvement.  Got covid-19 in 10/2020 - mild course. Not hospitalized. Took something specifically for covid.   She has tried following inhalers: Albuterol Arnuity - rinsed mouth after use. She noticed no effect.   Interval History 03/23/21 Difficulty breathing with unproductive cough. Coworker hears her wheezing. Difficulty sleeping due to cough. Denies fevers, body aches. Home COVID-19.   Otherwise pertinent ROS negative  Grandfather emphysema and aunt had COPD. Mother had esophageal cancer.  Just recently moved from [place] - was in [place]. Alexandra Archer in [place]. Has lived previously in [place], [place]. No recent travel. 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 No exposure to dusts/solvents/particulates at home or at work. No water damage. She has a yorkie. Had a cockatiel, got rid of it 4 ya she is unsure if she had some breathing issues back then. Never smoked. No vaping.  Past Medical History:  Diagnosis Date   Asthma    Breast cancer ([place]) 02/23/2021   GERD (gastroesophageal reflux disease)    Glaucoma    Goiter    Hyperlipidemia    Primary hypertension    Seasonal allergic rhinitis      Family History  Problem Relation Age of Onset   Esophageal cancer Mother    Stomach cancer Father    Heart attack Father 40   Heart attack Brother 29   Prostate cancer Paternal Grandfather      Past Surgical History:  Procedure Laterality Date   CESAREAN SECTION  1994   LAPAROSCOPIC CHOLECYSTECTOMY  2012   MENISCUS REPAIR Right 2001    Social History   Socioeconomic History   Marital status: Married    Spouse name: Not on file   Number of children: Not on file   Years of education: Not on file   Highest education level: Not on file  Occupational History   Not on file  Tobacco Use   Smoking status: Never    Passive exposure: Never   Smokeless tobacco: Never  Substance and Sexual Activity   Alcohol use: Yes    Comment: Social   Drug use: Not on file   Sexual activity: Yes  Other Topics Concern   Not on file  Social History Narrative   Lives at home with husband.     Social Determinants of Health   Financial Resource Strain: Not on file  Food Insecurity: Not on file  Transportation Needs: Not on file  Physical Activity: Not on file  Stress: Not on file  Social Connections: Not on file  Intimate Partner Violence: Not on file     No Known Allergies   Outpatient Medications Prior to Visit  Medication Sig Dispense Refill   albuterol (VENTOLIN HFA) 108 (90 Base) MCG/ACT inhaler Inhale 1-2 puffs into the lungs every 4 (four) hours as needed for wheezing or shortness of breath.      amLODipine (NORVASC) 5 MG tablet Take 5 mg by mouth daily.     Ascorbic Acid (VITAMIN C) 100 MG tablet Take 100 mg by mouth daily.     budesonide-formoterol (SYMBICORT) 80-4.5 MCG/ACT inhaler Inhale 2 puffs into the lungs in the morning and at bedtime. 1 each 12   Cholecalciferol (VITAMIN D3) 50 MCG (2000 UT) CAPS Take 2,000 Units by mouth daily.     Cyanocobalamin (B-12) 100 MCG TABS Take 100 mcg by mouth daily.     erythromycin ophthalmic ointment Place 1 application into the right eye in the morning, at noon, in the evening, and at bedtime.     fluticasone (FLONASE) 50 MCG/ACT nasal spray Place 1 spray into both nostrils daily. 16 g 2   latanoprost (XALATAN) 0.005 % ophthalmic solution Place 1 drop into both eyes at bedtime.     omeprazole (PRILOSEC) 40 MG capsule Take 40 mg by mouth daily.     predniSONE (DELTASONE) 10 MG tablet Take 10 mg by mouth daily with breakfast.     traZODone (DESYREL) 50 MG tablet Take 50 mg by mouth at bedtime as needed for sleep.     No facility-administered medications prior to visit.       Objective:   Physical Exam: General: Well-appearing, no acute distress HENT: [place], AT Eyes: EOMI, no scleral icterus Respiratory: Clear to auscultation bilaterally.  No crackles, wheezing or rales Cardiovascular: RRR, -M/R/G, no JVD Extremities:-Edema,-tenderness Neuro: AAO x4, CNII-XII grossly intact Psych: Normal mood, normal affect  Vitals:   03/23/21 1126  BP: (!) 142/86  Pulse: 83  Temp: 97.9 F (36.6 C)  TempSrc: Oral  SpO2: 98%  Weight: 162 lb 6.4 oz (73.7 kg)  Height: 5\' 7"  (1.702 m)   98% on RA BMI Readings from Last 3 Encounters:  03/23/21 25.44 kg/m  03/14/21 25.09 kg/m  02/15/21 25.72 kg/m   Wt Readings from Last 3 Encounters:  03/23/21 162 lb 6.4 oz (73.7 kg)  03/14/21 160 lb 3.2 oz (72.7 kg)  02/15/21 164 lb 3.2 oz (74.5 kg)     CBC    Component Value Date/Time   WBC 7.8 02/15/2021 1031   RBC 5.50 (H) 02/15/2021 1031   HGB  13.4 02/15/2021 1031   HCT 42.9 02/15/2021 1031   PLT 215.0 02/15/2021 1031   MCV 78.0 02/15/2021 1031   MCHC 31.3 02/15/2021 1031   RDW 15.2 02/15/2021 1031   LYMPHSABS 1.3 02/15/2021 1031   MONOABS 0.6 02/15/2021 1031   EOSABS 0.0 02/15/2021 1031   BASOSABS 0.0 02/15/2021 1031    Low level eosinophilia in past  Chest Imaging:  CT Cardiac scoring 10/06/20 reviewed by me remarkable for mosaic attenuation in this expiratory scan  Pulmonary Functions Testing Results: PFT Results Latest Ref Rng & Units 12/29/2020  FVC-Pre L 2.44  FVC-Predicted Pre % 76  FVC-Post L 2.40  FVC-Predicted Post % 75  Pre FEV1/FVC % % 66  Post FEV1/FCV % % 82  FEV1-Pre L 1.61  FEV1-Predicted Pre % 63  FEV1-Post L 1.97  DLCO uncorrected ml/min/mmHg 18.87  DLCO UNC% % 82  DLVA Predicted % 137  TLC L 4.06  TLC % Predicted % 73  RV % Predicted % 81   EKG Stress 09/21/20: There was no ST segment deviation noted during stress.   Exercise time: 6:31 min Workload: 7.8 METS, average exercise capacity Test stopped due to: fatigue, SOB BP response: elevated without hypertensive response to exercise, 203/93 mmHg. HR response: normal Rhythm: SR with occasional PVCs in recovery   No ischemia on treadmill stress ECG.    Absolute eos 02/15/21 - 0 IgE 02/15/21- 6     Assessment & Plan:   # Uncontrolled persistent asthma: Only triggers she identifies are irritants (cleaning agents) and exertion. In exacerbation  # Hoarseness: # Possible upper airway obstruction: FV loops suggestive of upper airway obstruction with consistent flattening of inspiratory and expiratory curve - did however improve somewhat with bronchodilator.  Plan: - START prednisone taper - DISCONTINUE Symbicort due to ineffectiveness and cost - START Adair Diskus 250-50 mcg ONE puff TWICE a day - CONTINUE Albuterol AS NEEDED for shortness of breath and wheezing - START atrovent 1 spray per nare during the day - CONTINUE flonase 1 spray  per nare at night - CONTINUE omeprazole - Keep ENT follow-up for repeatable abnormal flow volume loops suggestive of upper airway obstruction  RTC as needed   Tyaire Odem Rodman Pickle, MD [facility] 03/23/2021 12:03 PM

## 2021-03-23 NOTE — Progress Notes (Signed)
Surgical Instructions    Your procedure is scheduled on Thursday, December 15th, 2022.   Report to [facility] Main Entrance "A" at 10:00 A.M., then check in with the Admitting office.  Call this number if you have problems the morning of surgery:  [phone]   If you have any questions prior to your surgery date call [phone]: Open Monday-Friday 8am-4pm    Remember:  Do not eat after midnight the night before your surgery  You may drink clear liquids until 09:00 the morning of your surgery.   Clear liquids allowed are: Water, Non-Citrus Juices (without pulp), Carbonated Beverages, Clear Tea, Black Coffee ONLY (NO MILK, CREAM OR POWDERED CREAMER of any kind), and Gatorade  Patient Instructions  The night before surgery:  No food after midnight. ONLY clear liquids after midnight  The day of surgery (if you do NOT have diabetes):  Drink ONE (1) Pre-Surgery Clear Ensure by 09:00 the morning of surgery. Drink in one sitting. Do not sip.  This drink was given to you during your hospital  pre-op appointment visit.  Nothing else to drink after completing the  Pre-Surgery Clear Ensure.          If you have questions, please contact your surgeon's office.     Take these medicines the morning of surgery with A SIP OF WATER:  amLODipine (NORVASC) omeprazole (PRILOSEC) predniSONE (DELTASONE) erythromycin ophthalmic ointment fluticasone (FLONASE) fluticasone-salmeterol (ADVAIR DISKUS) ipratropium (ATROVENT)  If needed:   albuterol (VENTOLIN HFA)  Please, bring all inhalers with you the day of surgery.  As of today, STOP taking any Aspirin (unless otherwise instructed by your surgeon) Aleve, Naproxen, Ibuprofen, Motrin, Advil, Goody's, BC's, all herbal medications, fish oil, and all vitamins.   After your COVID test   You are not required to quarantine however you are required to wear a well-fitting mask when you are out and around people not in your household.  If your  mask becomes wet or soiled, replace with a new one.  Wash your hands often with soap and water for 20 seconds or clean your hands with an alcohol-based hand sanitizer that contains at least 60% alcohol.  Do not share personal items.  Notify your provider: if you are in close contact with someone who has COVID  or if you develop a fever of 100.4 or greater, sneezing, cough, sore throat, shortness of breath or body aches.    The day of surgery:          Do not wear jewelry or makeup Do not wear lotions, powders, perfumes, or deodorant. Do not shave 48 hours prior to surgery.   Do not bring valuables to the hospital DO Not wear nail polish, gel polish, artificial nails, or any other type of covering on natural nails including finger and toenails. If patients have artificial nails, gel coating, etc. that need to be removed by a nail salon, please have this removed prior to surgery or surgery may need to be canceled/delayed if the surgeon/ anesthesia feels like the patient is unable to be adequately monitored.              [facility] is not responsible for any belongings or valuables.  Do NOT Smoke (Tobacco/Vaping)  24 hours prior to your procedure  If you use a CPAP at night, you may bring your mask for your overnight stay.   Contacts, glasses, hearing aids, dentures or partials may not be worn into surgery, please bring cases for these belongings  For patients admitted to the hospital, discharge time will be determined by your treatment team.   Patients discharged the day of surgery will not be allowed to drive home, and someone needs to stay with them for 24 hours.  NO VISITORS WILL BE ALLOWED IN PRE-OP WHERE PATIENTS ARE PREPPED FOR SURGERY.  ONLY 1 SUPPORT PERSON MAY BE PRESENT IN THE WAITING ROOM WHILE YOU ARE IN SURGERY.  IF YOU ARE TO BE ADMITTED, ONCE YOU ARE IN YOUR ROOM YOU WILL BE ALLOWED TWO (2) VISITORS. 1 (ONE) VISITOR MAY STAY OVERNIGHT BUT MUST ARRIVE TO THE ROOM BY  8pm.  Minor children may have two parents present. Special consideration for safety and communication needs will be reviewed on a case by case basis.  Special instructions:    Oral Hygiene is also important to reduce your risk of infection.  Remember - BRUSH YOUR TEETH THE MORNING OF SURGERY WITH YOUR REGULAR TOOTHPASTE   [place]- Preparing For Surgery  Before surgery, you can play an important role. Because skin is not sterile, your skin needs to be as free of germs as possible. You can reduce the number of germs on your skin by washing with CHG (chlorahexidine gluconate) Soap before surgery.  CHG is an antiseptic cleaner which kills germs and bonds with the skin to continue killing germs even after washing.     Please do not use if you have an allergy to CHG or antibacterial soaps. If your skin becomes reddened/irritated stop using the CHG.  Do not shave (including legs and underarms) for at least 48 hours prior to first CHG shower. It is OK to shave your face.  Please follow these instructions carefully.     Shower the NIGHT BEFORE SURGERY and the MORNING OF SURGERY with CHG Soap.   If you chose to wash your hair, wash your hair first as usual with your normal shampoo. After you shampoo, rinse your hair and body thoroughly to remove the shampoo.  Then [employer] and genitals (private parts) with your normal soap and rinse thoroughly to remove soap.  After that Use CHG Soap as you would any other liquid soap. You can apply CHG directly to the skin and wash gently with a scrungie or a clean washcloth.   Apply the CHG Soap to your body ONLY FROM THE NECK DOWN.  Do not use on open wounds or open sores. Avoid contact with your eyes, ears, mouth and genitals (private parts). Wash Face and genitals (private parts)  with your normal soap.   Wash thoroughly, paying special attention to the area where your surgery will be performed.  Thoroughly rinse your body with warm water from the neck  down.  DO NOT shower/wash with your normal soap after using and rinsing off the CHG Soap.  Pat yourself dry with a CLEAN TOWEL.  Wear CLEAN PAJAMAS to bed the night before surgery  Place CLEAN SHEETS on your bed the night before your surgery  DO NOT SLEEP WITH PETS.   Day of Surgery:  Take a shower with CHG soap. Wear Clean/Comfortable clothing the morning of surgery Do not apply any deodorants/lotions.   Remember to brush your teeth WITH YOUR REGULAR TOOTHPASTE.   Please read over the following fact sheets that you were given.

## 2021-03-26 ENCOUNTER — Encounter (HOSPITAL_COMMUNITY): Payer: Self-pay

## 2021-03-26 ENCOUNTER — Encounter (HOSPITAL_COMMUNITY)
Admission: RE | Admit: 2021-03-26 | Discharge: 2021-03-26 | Disposition: A | Discharge status: Home / Self Care | Payer: BC Managed Care – PPO | Source: Ambulatory Visit | Attending: General Surgery | Admitting: General Surgery

## 2021-03-26 ENCOUNTER — Other Ambulatory Visit: Payer: Self-pay

## 2021-03-26 VITALS — BP 157/95 | HR 73 | Temp 98.3°F | Resp 17 | Ht 67.0 in | Wt 160.7 lb

## 2021-03-26 DIAGNOSIS — Z170 Estrogen receptor positive status [ER+]: Secondary | ICD-10-CM | POA: Diagnosis not present

## 2021-03-26 DIAGNOSIS — K219 Gastro-esophageal reflux disease without esophagitis: Secondary | ICD-10-CM | POA: Diagnosis not present

## 2021-03-26 DIAGNOSIS — D0511 Intraductal carcinoma in situ of right breast: Secondary | ICD-10-CM | POA: Diagnosis not present

## 2021-03-26 DIAGNOSIS — Z01818 Encounter for other preprocedural examination: Secondary | ICD-10-CM

## 2021-03-26 DIAGNOSIS — J449 Chronic obstructive pulmonary disease, unspecified: Secondary | ICD-10-CM | POA: Insufficient documentation

## 2021-03-26 DIAGNOSIS — Z7951 Long term (current) use of inhaled steroids: Secondary | ICD-10-CM | POA: Diagnosis not present

## 2021-03-26 DIAGNOSIS — D649 Anemia, unspecified: Secondary | ICD-10-CM | POA: Diagnosis not present

## 2021-03-26 DIAGNOSIS — N6001 Solitary cyst of right breast: Secondary | ICD-10-CM | POA: Diagnosis not present

## 2021-03-26 DIAGNOSIS — Z800 Family history of malignant neoplasm of digestive organs: Secondary | ICD-10-CM | POA: Diagnosis not present

## 2021-03-26 DIAGNOSIS — I10 Essential (primary) hypertension: Secondary | ICD-10-CM | POA: Diagnosis not present

## 2021-03-26 DIAGNOSIS — J45909 Unspecified asthma, uncomplicated: Secondary | ICD-10-CM | POA: Diagnosis not present

## 2021-03-26 DIAGNOSIS — Z01812 Encounter for preprocedural laboratory examination: Secondary | ICD-10-CM | POA: Insufficient documentation

## 2021-03-26 DIAGNOSIS — Z79899 Other long term (current) drug therapy: Secondary | ICD-10-CM | POA: Diagnosis not present

## 2021-03-26 DIAGNOSIS — J984 Other disorders of lung: Secondary | ICD-10-CM | POA: Diagnosis not present

## 2021-03-26 HISTORY — DX: Anemia, unspecified: D64.9

## 2021-03-26 HISTORY — DX: Anxiety disorder, unspecified: F41.9

## 2021-03-26 LAB — CBC
HCT: 46 % (ref 36.0–46.0)
Hemoglobin: 14.5 g/dL (ref 12.0–15.0)
MCH: 25.3 pg — ABNORMAL LOW (ref 26.0–34.0)
MCHC: 31.5 g/dL (ref 30.0–36.0)
MCV: 80.3 fL (ref 80.0–100.0)
Platelets: 251 10*3/uL (ref 150–400)
RBC: 5.73 MIL/uL — ABNORMAL HIGH (ref 3.87–5.11)
RDW: 14.4 % (ref 11.5–15.5)
WBC: 9.8 10*3/uL (ref 4.0–10.5)
nRBC: 0 % (ref 0.0–0.2)

## 2021-03-26 LAB — BASIC METABOLIC PANEL
Anion gap: 7 (ref 5–15)
BUN: 12 mg/dL (ref 6–20)
CO2: 32 mmol/L (ref 22–32)
Calcium: 9.5 mg/dL (ref 8.9–10.3)
Chloride: 102 mmol/L (ref 98–111)
Creatinine, Ser: 1.06 mg/dL — ABNORMAL HIGH (ref 0.44–1.00)
GFR, Estimated: >60 mL/min (ref >60)
Glucose, Bld: 88 mg/dL (ref 70–99)
Potassium: 3.3 mmol/L — ABNORMAL LOW (ref 3.5–5.1)
Sodium: 141 mmol/L (ref 135–145)

## 2021-03-26 NOTE — Progress Notes (Signed)
PCP - Thayer Ohm, PA Cardiologist - denies Oncologist - Benay Pike, MD Pulmonologist - Leslye Peer, MD  PPM/ICD - denies Device Orders - n/a Rep Notified - n/a  Chest x-ray - n/a EKG - 09/05/2020 Stress Test - 09/21/2020 ECHO - denies Cardiac Cath - denies  Sleep Study - denies CPAP - n/a  Fasting Blood Sugar - n/a  Blood Thinner Instructions: n/a  Aspirin Instructions: Patient was instructed: As of today, STOP taking any Aspirin (unless otherwise instructed by your surgeon) Aleve, Naproxen, Ibuprofen, Motrin, Advil, Goody's, BC's, all herbal medications, fish oil, and all vitamins.  ERAS Protcol - yes PRE-SURGERY Ensure - yes  COVID TEST- no -ambulatory surgery   Anesthesia review: yes. Patient had an office visit with her pulmonologist on 03/23/2021 for nonproductive cough. After she started Prednisone patient verbalized that she is feeling better.  Patient denies shortness of breath, fever, cough and chest pain at PAT appointment   All instructions explained to the patient, with a verbal understanding of the material. Patient agrees to go over the instructions while at home for a better understanding. Patient also instructed to self quarantine after being tested for COVID-19. The opportunity to ask questions was provided.

## 2021-03-26 NOTE — H&P (Signed)
REFERRING PHYSICIAN: Clelland, PA  PROVIDER: Georgianne Fick, MD  Care Team: Patient Care Team: Thayer Ohm as PCP - General   MRN: [id number] DOB: 1965/10/22 DATE OF ENCOUNTER: 03/05/2021  Subjective   Chief Complaint: Right Breast Cancer   History of Present Illness: Alexandra Archer is a 55 y.o. female who is seen today as an office consultation at the request of Clelland, PA, for evaluation of Right Breast Cancer .   Pt presents with a new dx of right breast cancer 02/2021. She had screening detected right breast calcifications. Diagnostic imaging confirmed this. There was a 1.4 cm group of coarse calcifications in the UOQ. There was a benign cyst at 1 o'clock 7 mm in greatest dimension. 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   She has no history of cancer before this dx. She has a mother who had esophageal cancer at age 74 and a father with a stomach sarcoma in his 41s.   Pt works at the [employer] of [employer].  Diagnostic mammogram: 02/16/21 ACR Breast Density Category c: The breast tissue is heterogeneously dense, which may obscure small masses.   FINDINGS: Within the posterolateral right breast there is a 1.4 cm group of coarse heterogeneous calcifications. These are not able to be completely imaged on magnification views given location and difficulty with patient positioning.   There is a persistent small lobular mass within the upper inner right breast.   On physical exam, no discrete mass palpated upper inner right breast.   Targeted ultrasound is performed, showing a 7 x 3 x 5 mm cyst right breast 1 o'clock position 4 cm from nipple.   IMPRESSION: Indeterminate right breast calcifications.   Right breast cyst.   RECOMMENDATION: Stereotactic guided core needle biopsy indeterminate right breast calcifications.   I have discussed the findings and recommendations with the patient. If  applicable, a reminder letter will be sent to the patient regarding the next appointment.   BI-RADS CATEGORY 4: Suspicious.  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 I have reviewed this information and discussed as appropriate with the patient. See HPI as well for other ROS.  Review of Systems  All other systems reviewed and are negative.   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)   Family history of cancer   Anxiety   Essential (primary) hypertension   Gastroesophageal reflux disease   Glaucoma   Goiter   Past Surgical History:  Procedure Laterality Date   CESAREAN SECTION 06/1992   Knee scope 2002   LAPAROSCOPIC CHOLECYSTECTOMY 2012    No Known Allergies  Current Outpatient Medications on File Prior to Visit  Medication Sig Dispense Refill   albuterol 90 mcg/actuation inhaler albuterol sulfate HFA 90 mcg/actuation aerosol inhaler   amLODIPine (NORVASC) 5 MG tablet amlodipine 5 mg tablet TAKE 1 TABLET BY MOUTH EVERY DAY FOR HIGH BLOOD PRESSURE   omeprazole (PRILOSEC) 40 MG DR capsule omeprazole 40  mg capsule,delayed release TAKE 1 CAPSULE BY MOUTH 30 MINUTES BEFORE MORNING MEAL EVERY DAY FOR 30 DAYS 90   ALPRAZolam (XANAX) 0.25 MG tablet alprazolam 0.25 mg tablet TAKE 1 TABLET EVERY DAY AS NEEDED FOR 10 DAYS   cholecalciferol (VITAMIN D3) 2,000 unit capsule Take by  mouth   citalopram (CELEXA) 20 MG tablet   cyanocobalamin (VITAMIN B12) 100 MCG tablet Take by mouth   latanoprost (XALATAN) 0.005 % ophthalmic solution latanoprost 0.005 % eye drops   SYMBICORT 80-4.5 mcg/actuation inhaler INHALE 2 PUFFS INTO THE LUNGS IN THE MORNING AND AT BEDTIME.   traZODone (DESYREL) 50 MG tablet   No current facility-administered medications on file prior to visit.   Family History  Problem Relation Age of Onset   Esophageal cancer Mother   Coronary Artery Disease (Blocked arteries around heart) Father   Stomach cancer Father    Social History   Tobacco Use  Smoking Status Never  Smokeless Tobacco Never    Social History   Socioeconomic History   Marital status: Married  Tobacco Use   Smoking status: Never   Smokeless tobacco: Never  Substance and Sexual Activity   Alcohol use: Yes  Comment: Social only   Drug use: Never   Objective:   Vitals:  03/05/21 0959  BP: 138/84  Pulse: 92  Temp: 37.1 C (98.7 F)  SpO2: 98%  Weight: 72 kg (158 lb 12.8 oz)  Height: 170.2 cm (5\' 7" )   Body mass index is 24.87 kg/m.  Gen: No acute distress. Well nourished and well groomed.  Neurological: Alert and oriented to person, place, and time. Coordination normal.  Head: Normocephalic and atraumatic.  Eyes: Conjunctivae are normal. Pupils are equal, round, and reactive to light. No scleral icterus.  Neck: Normal range of motion. Neck supple. No tracheal deviation or thyromegaly present.  Cardiovascular: Normal rate, regular rhythm, normal heart sounds and intact distal pulses. Exam reveals no gallop and no friction rub. No murmur heard. Breast: symmetric bilaterally. No masses palpable. Right UOQ a bit tender in biopsy site. No LAD. No nipple retraction or nipple discharge. No skin dimpling.  Respiratory: Effort normal. No respiratory distress. No chest wall tenderness. Breath sounds normal. No wheezes, rales or rhonchi.  GI: Soft. Bowel sounds are normal.  The abdomen is soft and nontender. There is no rebound and no guarding.  Musculoskeletal: Normal range of motion. Extremities are nontender.  Lymphadenopathy: No cervical, preauricular, postauricular or axillary adenopathy is present Skin: Skin is warm and dry. No rash noted. No diaphoresis. No erythema. No pallor. No clubbing, cyanosis, or edema.  Psychiatric: Normal mood and affect. Behavior is normal. Judgment and thought content normal.   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 This is amenable to breast conservation. I recommend a seed localized lumpectomy. She will need medical and radiation oncology referrals.   Lumpectomy would be followed by radiation and likely antihormonal tx.   The surgical procedure was described to the patient. I discussed the incision type and location and that we would need radiology involved on with a wire or seed marker and/or sentinel node.   The risks and benefits of the procedure were described to the patient and she wishes to proceed.   We discussed the risks bleeding, infection, damage to other structures, need for further procedures/surgeries. We discussed the risk of seroma. The patient was advised if the area in  the breast in cancer, we may need to go back to surgery for additional tissue to obtain negative margins or for a lymph node biopsy. The patient was advised that these are the most common complications, but that others can occur as well. They were advised against taking aspirin or other anti-inflammatory agents/blood thinners the week before surgery.   Family history of cancer Esophageal cancer and stomach sarcoma are not typically associated with breast cancer, but given her parents' young ages at dx, will make genetics referral. The patient has a 81 yo daughter as well to consider.   Milus Height, MD FACS Surgical Oncology, General  Surgery, Trauma and [facility]

## 2021-03-27 NOTE — Progress Notes (Signed)
Anesthesia Chart Review:  Follows with pulmonology for history of uncontrolled persistent asthma.  Triggers identified by patient include irritants (cleaning agents) and exertion.  Per pulmonology notes, flow-volume loop suggestive of upper airway obstruction with consistent flattening of inspiratory and expiratory curves - did however improve some with bronchodilator.  She was referred to ENT on 02/15/2021 for evaluation of possible upper airway obstruction but has not yet been seen.  She was last seen by pulmonology on 03/23/2021 for asthma exacerbation.  She was treated with prednisone taper.  Symbicort discontinued due to ineffectiveness and she was started on Advair.  She continues albuterol as needed.  Patient was seen in May 2022 by cardiology for evaluation of SOB.  She underwent exercise treadmill test which showed no ischemia and coronary CT showed calcium score of 0.  She was advised to follow-up with pulmonology.  Recommended follow-up with cardiology on an as-needed basis.  Reviewed pulmonary history with anesthesiologist Dr. Gifford Shave.  He advised okay to proceed as planned, will need day of surgery evaluation.  Preop labs reviewed, unremarkable.  EKG 08/16/2020: NSR.  Rate 70.  PFT 12/29/20: FVC-%Pred-Pre % 76  FEV1-%Pred-Pre % 63  FEV1FVC-%Pred-Pre % 81  TLC % pred % 73  RV % pred % 81  DLCO unc % pred % 82   Exercise tolerance test 09/21/20: Exercise time: 6:31 min Workload: 7.8 METS, average exercise capacity Test stopped due to: fatigue, SOB BP response: elevated without hypertensive response to exercise, 203/93 mmHg. HR response: normal Rhythm: SR with occasional PVCs in recovery   No ischemia on treadmill stress ECG.   CT cardiac scoring 09/16/2020: IMPRESSION: Coronary calcium score of 0. This is a low risk study.  Over read interpretation of CT chest 10/06/2020: FINDINGS: Mosaic attenuation in the lung parenchyma suggesting underlying air trapping. Within the visualized  portions of the thorax there are no suspicious appearing pulmonary nodules or masses, there is no acute consolidative airspace disease, no pleural effusions, no pneumothorax and no lymphadenopathy. Visualized portions of the upper abdomen are unremarkable. There are no aggressive appearing lytic or blastic lesions noted in the visualized portions of the skeleton.   IMPRESSION: 1. The appearance of the lungs suggests potential air trapping from small airways disease.   Wynonia Musty [facility] Short Stay Center/Anesthesiology Phone [phone] 03/27/2021 3:58 PM

## 2021-03-27 NOTE — Anesthesia Preprocedure Evaluation (Addendum)
Anesthesia Evaluation  Patient identified by MRN, date of birth, ID band Patient awake    Reviewed: Allergy & Precautions, NPO status , Patient's Chart, lab work & pertinent test results  Airway Mallampati: II  TM Distance: >3 FB     Dental   Pulmonary asthma ,  COPD inhaler,    breath sounds clear to auscultation       Cardiovascular hypertension, Pt. on medications  Rhythm:Regular Rate:Normal     Neuro/Psych Anxiety negative neurological ROS     GI/Hepatic Neg liver ROS, GERD  Medicated,  Endo/Other  negative endocrine ROS  Renal/GU negative Renal ROS  negative genitourinary   Musculoskeletal Right breast Ca   Abdominal   Peds  Hematology  (+) anemia , Lab Results      Component                Value               Date                      WBC                      9.8                 03/26/2021                HGB                      14.5                03/26/2021                HCT                      46.0                03/26/2021                MCV                      80.3                03/26/2021                PLT                      251                 03/26/2021           Lab Results      Component                Value               Date                      NA                       141                 03/26/2021                K                        3.3 (L)  03/26/2021                CO2                      32                  03/26/2021                GLUCOSE                  88                  03/26/2021                BUN                      12                  03/26/2021                CREATININE               1.06 (H)            03/26/2021                CALCIUM                  9.5                 03/26/2021                GFRNONAA                 >60                 03/26/2021             Anesthesia Other Findings   Reproductive/Obstetrics                            Anesthesia Physical Anesthesia Plan  ASA: 2  Anesthesia Plan: General   Post-op Pain Management: Celebrex PO (pre-op) and Tylenol PO (pre-op)   Induction: Intravenous  PONV Risk Score and Plan: 3 and Ondansetron, Dexamethasone, Midazolam and Treatment may vary due to age or medical condition  Airway Management Planned: Mask and LMA  Additional Equipment: None  Intra-op Plan:   Post-operative Plan: Extubation in OR  Informed Consent: I have reviewed the patients History and Physical, chart, labs and discussed the procedure including the risks, benefits and alternatives for the proposed anesthesia with the patient or authorized representative who has indicated his/her understanding and acceptance.     Dental advisory given  Plan Discussed with: CRNA and Anesthesiologist  Anesthesia Plan Comments: (PAT note by Karoline Caldwell, PA-C: Follows with pulmonology for history of uncontrolled persistent asthma.  Triggers identified by patient include irritants (cleaning agents) and exertion.  Per pulmonology notes, flow-volume loop suggestive of upper airway obstruction with consistent flattening of inspiratory and expiratory curves - did however improve some with bronchodilator.  She was referred to ENT on 02/15/2021 for evaluation of possible upper airway obstruction but has not yet been seen.  She was last seen by pulmonology on 03/23/2021 for asthma exacerbation.  She was treated with prednisone taper.  Symbicort discontinued due to ineffectiveness and she was started on Advair.  She continues albuterol as needed.  Patient was seen in May 2022 by  cardiology for evaluation of SOB.  She underwent exercise treadmill test which showed no ischemia and coronary CT showed calcium score of 0.  She was advised to follow-up with pulmonology.  Recommended follow-up with cardiology on an as-needed basis.  Reviewed pulmonary history with anesthesiologist Dr. Gifford Shave.  He advised okay to proceed as  planned, will need day of surgery evaluation.  Preop labs reviewed, unremarkable.  EKG 08/16/2020: NSR.  Rate 70.  PFT 12/29/20: FVC-%Pred-Pre % 76 FEV1-%Pred-Pre % 63 FEV1FVC-%Pred-Pre % 81 TLC % pred % 73 RV % pred % 81 DLCO unc % pred % 82  Exercise tolerance test 09/21/20: Exercise time: 6:31 min Workload: 7.8 METS, average exercise capacity Test stopped due to: fatigue, SOB BP response: elevated without hypertensive response to exercise, 203/93 mmHg. HR response: normal Rhythm: SR with occasional PVCs in recovery  No ischemia on treadmill stress ECG.   CT cardiac scoring 09/16/2020: IMPRESSION: Coronary calcium score of 0. This is a low risk study.  Over read interpretation of CT chest 10/06/2020: FINDINGS: Mosaic attenuation in the lung parenchyma suggesting underlying air trapping. Within the visualized portions of the thorax there are no suspicious appearing pulmonary nodules or masses, there is no acute consolidative airspace disease, no pleural effusions, no pneumothorax and no lymphadenopathy. Visualized portions of the upper abdomen are unremarkable. There are no aggressive appearing lytic or blastic lesions noted in the visualized portions of the skeleton.  IMPRESSION: 1. The appearance of the lungs suggests potential air trapping from small airways disease. )      Anesthesia Quick Evaluation

## 2021-03-28 ENCOUNTER — Ambulatory Visit
Admission: RE | Admit: 2021-03-28 | Discharge: 2021-03-28 | Disposition: A | Discharge status: Home / Self Care | Payer: BC Managed Care – PPO | Source: Ambulatory Visit | Attending: General Surgery | Admitting: General Surgery

## 2021-03-28 DIAGNOSIS — C50411 Malignant neoplasm of upper-outer quadrant of right female breast: Secondary | ICD-10-CM

## 2021-03-28 DIAGNOSIS — D0511 Intraductal carcinoma in situ of right breast: Secondary | ICD-10-CM | POA: Diagnosis not present

## 2021-03-29 ENCOUNTER — Ambulatory Visit (HOSPITAL_COMMUNITY): Payer: BC Managed Care – PPO | Admitting: Anesthesiology

## 2021-03-29 ENCOUNTER — Ambulatory Visit (HOSPITAL_COMMUNITY): Payer: BC Managed Care – PPO | Admitting: Vascular Surgery

## 2021-03-29 ENCOUNTER — Encounter (HOSPITAL_COMMUNITY)
Admission: RE | Disposition: A | Discharge status: Home / Self Care | Payer: Self-pay | Source: Home / Self Care | Attending: General Surgery

## 2021-03-29 ENCOUNTER — Ambulatory Visit
Admission: RE | Admit: 2021-03-29 | Discharge: 2021-03-29 | Disposition: A | Discharge status: Home / Self Care | Payer: BC Managed Care – PPO | Source: Ambulatory Visit | Attending: General Surgery | Admitting: General Surgery

## 2021-03-29 ENCOUNTER — Encounter (HOSPITAL_COMMUNITY): Payer: Self-pay | Admitting: General Surgery

## 2021-03-29 ENCOUNTER — Ambulatory Visit (HOSPITAL_COMMUNITY)
Admission: RE | Admit: 2021-03-29 | Discharge: 2021-03-29 | Disposition: A | Discharge status: Home / Self Care | Payer: BC Managed Care – PPO | Attending: General Surgery | Admitting: General Surgery

## 2021-03-29 DIAGNOSIS — D0511 Intraductal carcinoma in situ of right breast: Secondary | ICD-10-CM | POA: Insufficient documentation

## 2021-03-29 DIAGNOSIS — C50411 Malignant neoplasm of upper-outer quadrant of right female breast: Secondary | ICD-10-CM | POA: Diagnosis not present

## 2021-03-29 DIAGNOSIS — J984 Other disorders of lung: Secondary | ICD-10-CM | POA: Insufficient documentation

## 2021-03-29 DIAGNOSIS — N641 Fat necrosis of breast: Secondary | ICD-10-CM | POA: Diagnosis not present

## 2021-03-29 DIAGNOSIS — Z170 Estrogen receptor positive status [ER+]: Secondary | ICD-10-CM | POA: Insufficient documentation

## 2021-03-29 DIAGNOSIS — Z7951 Long term (current) use of inhaled steroids: Secondary | ICD-10-CM | POA: Insufficient documentation

## 2021-03-29 DIAGNOSIS — K219 Gastro-esophageal reflux disease without esophagitis: Secondary | ICD-10-CM | POA: Insufficient documentation

## 2021-03-29 DIAGNOSIS — J45909 Unspecified asthma, uncomplicated: Secondary | ICD-10-CM | POA: Insufficient documentation

## 2021-03-29 DIAGNOSIS — R928 Other abnormal and inconclusive findings on diagnostic imaging of breast: Secondary | ICD-10-CM | POA: Diagnosis not present

## 2021-03-29 DIAGNOSIS — Z79899 Other long term (current) drug therapy: Secondary | ICD-10-CM | POA: Insufficient documentation

## 2021-03-29 DIAGNOSIS — N6001 Solitary cyst of right breast: Secondary | ICD-10-CM | POA: Insufficient documentation

## 2021-03-29 DIAGNOSIS — I10 Essential (primary) hypertension: Secondary | ICD-10-CM | POA: Insufficient documentation

## 2021-03-29 DIAGNOSIS — D649 Anemia, unspecified: Secondary | ICD-10-CM | POA: Insufficient documentation

## 2021-03-29 DIAGNOSIS — Z800 Family history of malignant neoplasm of digestive organs: Secondary | ICD-10-CM | POA: Insufficient documentation

## 2021-03-29 HISTORY — PX: BREAST LUMPECTOMY: SHX2

## 2021-03-29 HISTORY — PX: BREAST LUMPECTOMY WITH RADIOACTIVE SEED LOCALIZATION: SHX6424

## 2021-03-29 SURGERY — BREAST LUMPECTOMY WITH RADIOACTIVE SEED LOCALIZATION
Anesthesia: General | Site: Breast | Laterality: Right

## 2021-03-29 MED ORDER — CEFAZOLIN SODIUM-DEXTROSE 2-4 GM/100ML-% IV SOLN
INTRAVENOUS | Status: AC
  Filled 2021-03-29: qty 100

## 2021-03-29 MED ORDER — LIDOCAINE HCL (PF) 1 % IJ SOLN
INTRAMUSCULAR | Status: AC
  Filled 2021-03-29: qty 30

## 2021-03-29 MED ORDER — CELECOXIB 200 MG PO CAPS
ORAL_CAPSULE | ORAL | Status: AC
  Filled 2021-03-29: qty 1

## 2021-03-29 MED ORDER — LIDOCAINE HCL (CARDIAC) PF 100 MG/5ML IV SOSY
PREFILLED_SYRINGE | INTRAVENOUS | Status: DC | PRN
  Administered 2021-03-29: 80 mg via INTRATRACHEAL

## 2021-03-29 MED ORDER — CELECOXIB 200 MG PO CAPS
200.0000 mg | ORAL_CAPSULE | Freq: Once | ORAL | Status: AC
  Administered 2021-03-29: 200 mg via ORAL

## 2021-03-29 MED ORDER — CHLORHEXIDINE GLUCONATE 0.12 % MT SOLN: 15.0000 mL | Freq: Once | OROMUCOSAL | Status: AC

## 2021-03-29 MED ORDER — CHLORHEXIDINE GLUCONATE CLOTH 2 % EX PADS: 6.0000 | MEDICATED_PAD | Freq: Once | CUTANEOUS | Status: DC

## 2021-03-29 MED ORDER — GLYCOPYRROLATE 0.2 MG/ML IJ SOLN
INTRAMUSCULAR | Status: DC | PRN
  Administered 2021-03-29: .1 mg via INTRAVENOUS

## 2021-03-29 MED ORDER — CEFAZOLIN SODIUM-DEXTROSE 2-3 GM-%(50ML) IV SOLR
INTRAVENOUS | Status: DC | PRN
  Administered 2021-03-29: 2 g via INTRAVENOUS

## 2021-03-29 MED ORDER — ONDANSETRON HCL 4 MG/2ML IJ SOLN
INTRAMUSCULAR | Status: DC | PRN
  Administered 2021-03-29: 4 mg via INTRAVENOUS

## 2021-03-29 MED ORDER — BUPIVACAINE-EPINEPHRINE (PF) 0.25% -1:200000 IJ SOLN
INTRAMUSCULAR | Status: AC
  Filled 2021-03-29: qty 30

## 2021-03-29 MED ORDER — ENSURE PRE-SURGERY PO LIQD: 296.0000 mL | Freq: Once | ORAL | Status: DC

## 2021-03-29 MED ORDER — OXYCODONE HCL 5 MG PO TABS: 5.0000 mg | ORAL_TABLET | Freq: Four times a day (QID) | ORAL | 0 refills | Status: DC | PRN

## 2021-03-29 MED ORDER — FENTANYL CITRATE (PF) 100 MCG/2ML IJ SOLN
INTRAMUSCULAR | Status: AC
  Filled 2021-03-29: qty 2

## 2021-03-29 MED ORDER — DEXAMETHASONE SODIUM PHOSPHATE 10 MG/ML IJ SOLN
INTRAMUSCULAR | Status: DC | PRN
  Administered 2021-03-29: 10 mg via INTRAVENOUS

## 2021-03-29 MED ORDER — PROMETHAZINE HCL 25 MG/ML IJ SOLN: 6.2500 mg | INTRAMUSCULAR | Status: DC | PRN

## 2021-03-29 MED ORDER — FENTANYL CITRATE (PF) 250 MCG/5ML IJ SOLN
INTRAMUSCULAR | Status: AC
  Filled 2021-03-29: qty 5

## 2021-03-29 MED ORDER — ACETAMINOPHEN 500 MG PO TABS
ORAL_TABLET | ORAL | Status: AC
  Administered 2021-03-29: 1000 mg via ORAL
  Filled 2021-03-29: qty 2

## 2021-03-29 MED ORDER — GABAPENTIN 100 MG PO CAPS: 200.0000 mg | ORAL_CAPSULE | ORAL | Status: AC

## 2021-03-29 MED ORDER — GABAPENTIN 100 MG PO CAPS
ORAL_CAPSULE | ORAL | Status: AC
  Administered 2021-03-29: 200 mg via ORAL
  Filled 2021-03-29: qty 2

## 2021-03-29 MED ORDER — MIDAZOLAM HCL 2 MG/2ML IJ SOLN
INTRAMUSCULAR | Status: DC | PRN
  Administered 2021-03-29: 2 mg via INTRAVENOUS

## 2021-03-29 MED ORDER — 0.9 % SODIUM CHLORIDE (POUR BTL) OPTIME
TOPICAL | Status: DC | PRN
  Administered 2021-03-29: 1000 mL

## 2021-03-29 MED ORDER — MIDAZOLAM HCL 2 MG/2ML IJ SOLN
INTRAMUSCULAR | Status: AC
  Filled 2021-03-29: qty 2

## 2021-03-29 MED ORDER — FENTANYL CITRATE (PF) 250 MCG/5ML IJ SOLN
INTRAMUSCULAR | Status: DC | PRN
  Administered 2021-03-29: 50 ug via INTRAVENOUS
  Administered 2021-03-29: 25 ug via INTRAVENOUS
  Administered 2021-03-29: 50 ug via INTRAVENOUS
  Administered 2021-03-29: 25 ug via INTRAVENOUS

## 2021-03-29 MED ORDER — PROPOFOL 10 MG/ML IV BOLUS
INTRAVENOUS | Status: DC | PRN
  Administered 2021-03-29: 130 mg via INTRAVENOUS

## 2021-03-29 MED ORDER — CEFAZOLIN SODIUM-DEXTROSE 2-4 GM/100ML-% IV SOLN: 2.0000 g | INTRAVENOUS | Status: DC

## 2021-03-29 MED ORDER — LACTATED RINGERS IV SOLN: INTRAVENOUS | Status: DC

## 2021-03-29 MED ORDER — CHLORHEXIDINE GLUCONATE 0.12 % MT SOLN
OROMUCOSAL | Status: AC
  Administered 2021-03-29: 15 mL via OROMUCOSAL
  Filled 2021-03-29: qty 15

## 2021-03-29 MED ORDER — PROPOFOL 10 MG/ML IV BOLUS
INTRAVENOUS | Status: AC
  Filled 2021-03-29: qty 20

## 2021-03-29 MED ORDER — ACETAMINOPHEN 500 MG PO TABS: 1000.0000 mg | ORAL_TABLET | ORAL | Status: AC

## 2021-03-29 MED ORDER — FENTANYL CITRATE (PF) 100 MCG/2ML IJ SOLN
25.0000 ug | INTRAMUSCULAR | Status: DC | PRN
  Administered 2021-03-29: 25 ug via INTRAVENOUS

## 2021-03-29 MED ORDER — ORAL CARE MOUTH RINSE: 15.0000 mL | Freq: Once | OROMUCOSAL | Status: AC

## 2021-03-29 MED ORDER — LIDOCAINE HCL 1 % IJ SOLN
INTRAMUSCULAR | Status: DC | PRN
  Administered 2021-03-29: 40 mL

## 2021-03-29 SURGICAL SUPPLY — 44 items
BAG COUNTER SPONGE SURGICOUNT (BAG) ×2 IMPLANT
BINDER BREAST LRG (GAUZE/BANDAGES/DRESSINGS) IMPLANT
BINDER BREAST XLRG (GAUZE/BANDAGES/DRESSINGS) IMPLANT
BINDER BREAST XXLRG (GAUZE/BANDAGES/DRESSINGS) ×1 IMPLANT
BLADE SURG 10 STRL SS (BLADE) ×2 IMPLANT
CANISTER SUCT 3000ML PPV (MISCELLANEOUS) IMPLANT
CHLORAPREP W/TINT 26 (MISCELLANEOUS) ×2 IMPLANT
CLIP VESOCCLUDE LG 6/CT (CLIP) ×2 IMPLANT
CLSR STERI-STRIP ANTIMIC 1/2X4 (GAUZE/BANDAGES/DRESSINGS) ×1 IMPLANT
COVER PROBE W GEL 5X96 (DRAPES) ×2 IMPLANT
COVER SURGICAL LIGHT HANDLE (MISCELLANEOUS) ×2 IMPLANT
DERMABOND ADVANCED (GAUZE/BANDAGES/DRESSINGS) ×1
DERMABOND ADVANCED .7 DNX12 (GAUZE/BANDAGES/DRESSINGS) ×1 IMPLANT
DEVICE DUBIN SPECIMEN MAMMOGRA (MISCELLANEOUS) ×2 IMPLANT
DRAPE CHEST BREAST 15X10 FENES (DRAPES) ×2 IMPLANT
DRSG PAD ABDOMINAL 8X10 ST (GAUZE/BANDAGES/DRESSINGS) ×2 IMPLANT
ELECT COATED BLADE 2.86 ST (ELECTRODE) ×2 IMPLANT
ELECT REM PT RETURN 9FT ADLT (ELECTROSURGICAL) ×2
ELECTRODE REM PT RTRN 9FT ADLT (ELECTROSURGICAL) ×1 IMPLANT
GAUZE SPONGE 4X4 12PLY STRL (GAUZE/BANDAGES/DRESSINGS) ×1 IMPLANT
GAUZE SPONGE 4X4 12PLY STRL LF (GAUZE/BANDAGES/DRESSINGS) ×2 IMPLANT
GLOVE SURG ENC MOIS LTX SZ6 (GLOVE) ×2 IMPLANT
GLOVE SURG UNDER LTX SZ6.5 (GLOVE) ×2 IMPLANT
GOWN STRL REUS W/ TWL LRG LVL3 (GOWN DISPOSABLE) ×1 IMPLANT
GOWN STRL REUS W/TWL 2XL LVL3 (GOWN DISPOSABLE) ×2 IMPLANT
GOWN STRL REUS W/TWL LRG LVL3 (GOWN DISPOSABLE) ×1
KIT BASIN OR (CUSTOM PROCEDURE TRAY) ×2 IMPLANT
KIT MARKER MARGIN INK (KITS) ×2 IMPLANT
LIGHT WAVEGUIDE WIDE FLAT (MISCELLANEOUS) IMPLANT
NDL HYPO 25GX1X1/2 BEV (NEEDLE) ×1 IMPLANT
NEEDLE HYPO 25GX1X1/2 BEV (NEEDLE) ×2 IMPLANT
NS IRRIG 1000ML POUR BTL (IV SOLUTION) IMPLANT
PACK GENERAL/GYN (CUSTOM PROCEDURE TRAY) ×2 IMPLANT
PAD ABD 8X10 STRL (GAUZE/BANDAGES/DRESSINGS) ×1 IMPLANT
STRIP CLOSURE SKIN 1/2X4 (GAUZE/BANDAGES/DRESSINGS) ×2 IMPLANT
SUT MNCRL AB 4-0 PS2 18 (SUTURE) ×2 IMPLANT
SUT SILK 2 0 SH (SUTURE) IMPLANT
SUT VIC AB 2-0 SH 27 (SUTURE) ×1
SUT VIC AB 2-0 SH 27XBRD (SUTURE) ×1 IMPLANT
SUT VIC AB 3-0 SH 27 (SUTURE) ×1
SUT VIC AB 3-0 SH 27X BRD (SUTURE) ×1 IMPLANT
SYR CONTROL 10ML LL (SYRINGE) ×2 IMPLANT
TOWEL GREEN STERILE (TOWEL DISPOSABLE) ×2 IMPLANT
TOWEL GREEN STERILE FF (TOWEL DISPOSABLE) ×2 IMPLANT

## 2021-03-29 NOTE — Transfer of Care (Signed)
Immediate Anesthesia Transfer of Care Note  Patient: Alexandra Archer  Procedure(s) Performed: RIGHT BREAST LUMPECTOMY WITH RADIOACTIVE SEED LOCALIZATION (Right: Breast)  Patient Location: PACU  Anesthesia Type:General  Level of Consciousness: awake, alert  and oriented  Airway & Oxygen Therapy: Patient Spontanous Breathing and Patient connected to nasal cannula oxygen  Post-op Assessment: Report given to RN and Post -op Vital signs reviewed and stable  Post vital signs: Reviewed and stable  Last Vitals:  Vitals Value Taken Time  BP 171/80 03/29/21 1320  Temp    Pulse 120 03/29/21 1320  Resp 15 03/29/21 1320  SpO2 97 % 03/29/21 1320  Vitals shown include unvalidated device data.  Last Pain:  Vitals:   03/29/21 1024  TempSrc:   PainSc: 0-No pain      Patients Stated Pain Goal: 0 (93/26/71 2458)  Complications: No notable events documented.

## 2021-03-29 NOTE — Progress Notes (Signed)
75 mcg IV fentanyl wasted with Suzan Garibaldi, RN after patient discharge.

## 2021-03-29 NOTE — Discharge Instructions (Addendum)
[facility] Office Phone Number [phone]  BREAST BIOPSY/ PARTIAL MASTECTOMY: POST OP INSTRUCTIONS  Always review your discharge instruction sheet given to you by the facility where your surgery was performed.  IF YOU HAVE DISABILITY OR FAMILY LEAVE FORMS, YOU MUST BRING THEM TO THE OFFICE FOR PROCESSING.  DO NOT GIVE THEM TO YOUR DOCTOR.  A prescription for pain medication may be given to you upon discharge.  Take your pain medication as prescribed, if needed.  If narcotic pain medicine is not needed, then you may take acetaminophen (Tylenol) or ibuprofen (Advil) as needed. Take your usually prescribed medications unless otherwise directed If you need a refill on your pain medication, please contact your pharmacy.  They will contact our office to request authorization.  Prescriptions will not be filled after 5pm or on week-ends. You should eat very light the first 24 hours after surgery, such as soup, crackers, pudding, etc.  Resume your normal diet the day after surgery. Most patients will experience some swelling and bruising in the breast.  Ice packs and a good support bra will help.  Swelling and bruising can take several days to resolve.  It is common to experience some constipation if taking pain medication after surgery.  Increasing fluid intake and taking a stool softener will usually help or prevent this problem from occurring.  A mild laxative (Milk of Magnesia or Miralax) should be taken according to package directions if there are no bowel movements after 48 hours. Unless discharge instructions indicate otherwise, you may remove your bandages 48 hours after surgery, and you may shower at that time.  You may have steri-strips (small skin tapes) in place directly over the incision.  These strips should be left on the skin for 7-10 days.   Any sutures or staples will be removed at the office during your follow-up visit. ACTIVITIES:  You may resume regular daily activities  (gradually increasing) beginning the next day.  Wearing a good support bra or sports bra (or the breast binder) minimizes pain and swelling.  You may have sexual intercourse when it is comfortable. You may drive when you no longer are taking prescription pain medication, you can comfortably wear a seatbelt, and you can safely maneuver your car and apply brakes. RETURN TO WORK:  __________1 week_______________ You should see your doctor in the office for a follow-up appointment approximately two weeks after your surgery.  Your doctor's nurse will typically make your follow-up appointment when she calls you with your pathology report.  Expect your pathology report 2-3 business days after your surgery.  You may call to check if you do not hear from us after three days.   WHEN TO CALL YOUR DOCTOR: Fever over 101.0 Nausea and/or vomiting. Extreme swelling or bruising. Continued bleeding from incision. Increased pain, redness, or drainage from the incision.  The clinic staff is available to answer your questions during regular business hours.  Please don't hesitate to call and ask to speak to one of the nurses for clinical concerns.  If you have a medical emergency, go to the nearest emergency room or call 911.  A surgeon from [facility] is always on call at the hospital.  For further questions, please visit [email or web address]   

## 2021-03-29 NOTE — Anesthesia Postprocedure Evaluation (Signed)
Anesthesia Post Note  Patient: Alexandra Archer  Procedure(s) Performed: RIGHT BREAST LUMPECTOMY WITH RADIOACTIVE SEED LOCALIZATION (Right: Breast)     Patient location during evaluation: PACU Anesthesia Type: General Level of consciousness: awake Pain management: pain level controlled Vital Signs Assessment: post-procedure vital signs reviewed and stable Cardiovascular status: stable Postop Assessment: no apparent nausea or vomiting Anesthetic complications: no   No notable events documented.  Last Vitals:  Vitals:   03/29/21 1335 03/29/21 1350  BP: (!) 172/92 (!) 162/94  Pulse: 88 88  Resp: 13 15  Temp:  (!) 36.4 C  SpO2: 95% 94%    Last Pain:  Vitals:   03/29/21 1350  TempSrc:   PainSc: 2                  Rebbecca Osuna

## 2021-03-29 NOTE — Anesthesia Procedure Notes (Addendum)
Procedure Name: LMA Insertion Date/Time: 03/29/2021 12:12 PM Performed by: Reeves Dam, CRNA Pre-anesthesia Checklist: Patient identified, Patient being monitored, Timeout performed, Emergency Drugs available and Suction available Patient Re-evaluated:Patient Re-evaluated prior to induction Oxygen Delivery Method: Circle system utilized Preoxygenation: Pre-oxygenation with 100% oxygen Induction Type: IV induction Ventilation: Mask ventilation without difficulty LMA: LMA inserted LMA Size: 4.0 Tube type: Oral Number of attempts: 1 Placement Confirmation: positive ETCO2 and breath sounds checked- equal and bilateral Tube secured with: Tape Dental Injury: Teeth and Oropharynx as per pre-operative assessment

## 2021-03-29 NOTE — Interval H&P Note (Signed)
History and Physical Interval Note:  03/29/2021 11:39 AM  Alexandra Archer  has presented today for surgery, with the diagnosis of RIGHT BREAST CANCER.  The various methods of treatment have been discussed with the patient and family. After consideration of risks, benefits and other options for treatment, the patient has consented to  Procedure(s) with comments: RIGHT BREAST LUMPECTOMY WITH RADIOACTIVE SEED LOCALIZATION (Right) - 90 MINUTES ROOM 2 as a surgical intervention.  The patient's history has been reviewed, patient examined, no change in status, stable for surgery.  I have reviewed the patient's chart and labs.  Questions were answered to the patient's satisfaction.     Stark Klein

## 2021-03-29 NOTE — Anesthesia Postprocedure Evaluation (Signed)
Anesthesia Post Note  Patient: Alexandra Archer  Procedure(s) Performed: RIGHT BREAST LUMPECTOMY WITH RADIOACTIVE SEED LOCALIZATION (Right: Breast)     Patient location during evaluation: PACU Anesthesia Type: General Level of consciousness: awake Pain management: pain level controlled Vital Signs Assessment: post-procedure vital signs reviewed and stable Cardiovascular status: stable Postop Assessment: no apparent nausea or vomiting Anesthetic complications: no   No notable events documented.  Last Vitals:  Vitals:   03/29/21 1335 03/29/21 1350  BP: (!) 172/92 (!) 162/94  Pulse: 88 88  Resp: 13 15  Temp:  (!) 36.4 C  SpO2: 95% 94%    Last Pain:  Vitals:   03/29/21 1350  TempSrc:   PainSc: 2                  Giliana Vantil

## 2021-03-29 NOTE — Op Note (Signed)
Right Breast Radioactive seed localized lumpectomy  Indications: This patient presents with history of right breast cancer, high grade DCIS, cTis, upper outer quadrant, receptors ER +, PR -  Pre-operative Diagnosis: right breast cancer  Post-operative Diagnosis: Same  Surgeon: Stark Klein   Anesthesia: General endotracheal anesthesia  ASA Class: 2  Procedure Details  The patient was seen in the Holding Room. The risks, benefits, complications, treatment options, and expected outcomes were discussed with the patient. The possibilities of bleeding, infection, the need for additional procedures, failure to diagnose a condition, and creating a complication requiring other procedures or operations were discussed with the patient. The patient concurred with the proposed plan, giving informed consent.  The site of surgery properly noted/marked. The patient was taken to Operating Room # 2, identified, and the procedure verified as right breast seed localized lumpectomy.  The right breast and chest were prepped and draped in standard fashion. A superolateral circumareolar incision was made near the previously placed radioactive seed.  Dissection was carried down around the point of maximum signal intensity. The cautery was used to perform the dissection.   The specimen was inked with the margin marker paint kit.    Specimen radiography confirmed inclusion of the mammographic lesion, the clip, and the seed.  The seed and clip appeared close to the medial aspect of the specimen, so an additional margin was taken. The background signal in the breast was zero.  Hemostasis was achieved with cautery.  The cavity was marked with clips on each border other than the anterior border.  The wound was irrigated and closed with 3-0 vicryl interrupted deep dermal sutures and 4-0 monocryl running subcuticular suture.      Sterile dressings were applied. At the end of the operation, all sponge, instrument, and needle  counts were correct.   Findings: Seed, clip in specimen.  Posterior margin is pectoralis   Estimated Blood Loss:  min         Specimens: left breast tissue with seed, additional medial margin         Complications:  None; patient tolerated the procedure well.         Disposition: PACU - hemodynamically stable.         Condition: stable

## 2021-03-30 ENCOUNTER — Encounter (HOSPITAL_COMMUNITY): Payer: Self-pay | Admitting: General Surgery

## 2021-04-02 LAB — SURGICAL PATHOLOGY

## 2021-04-04 NOTE — Progress Notes (Signed)
[facility] NOTE  Patient Care Team: [place], [name], [place] as PCP - General (Internal Medicine) [name], RN as Oncology Nurse Navigator [name], RN as Oncology Nurse Navigator  CHIEF COMPLAINTS/PURPOSE OF CONSULTATION:  Newly diagnosed DCIS  HISTORY OF PRESENTING ILLNESS:  Alexandra Archer 55 y.o. female is here because of recent diagnosis of right breast DCIS  I reviewed her records extensively and collaborated the history with the patient.  SUMMARY OF ONCOLOGIC HISTORY: Oncology History Overview Note  Nov 2022: She was found to have screen detected right breast calcifications.Diagnostic imaging confirmed this. There was a 1.4 cm group of coarse calcifications in the UOQ. There was a benign cyst at 1 o'clock 7 mm in greatest dimension. 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  She had a right breast lumpectomy by Dr. Barry Dienes on 03/29/2021 and pathology from this showed ductal carcinoma in situ, high-grade, 1.3 cm, margins uninvolved by carcinoma, prognostic showed ER positive, 95%, moderate, PR negative.   Ductal carcinoma in situ (DCIS) of right breast  03/14/2021 Initial Diagnosis   Ductal carcinoma in situ (DCIS) of right breast    2019,right breast biopsy, benign. 2004, right breast fibroadenoma removed.  She is healing well and he is here today with her husband.  She has her follow-up with Dr. Barry Dienes in mid January and will follow-up with Dr. Lisbeth Renshaw for adjuvant radiation.  She denies any family history of breast cancer.  She is otherwise healthy at baseline besides hypertension. Rest of the pertinent 10 point ROS reviewed and negative.  MEDICAL HISTORY:  Past Medical History:  Diagnosis Date   Anemia    Anxiety    Asthma    Breast cancer ([place]) 02/23/2021   GERD (gastroesophageal reflux disease)    Glaucoma    Goiter    Hyperlipidemia    Primary hypertension    Seasonal allergic rhinitis      SURGICAL HISTORY: Past Surgical History:  Procedure Laterality Date   BREAST LUMPECTOMY WITH RADIOACTIVE SEED LOCALIZATION Right 03/29/2021   Procedure: RIGHT BREAST LUMPECTOMY WITH RADIOACTIVE SEED LOCALIZATION;  Surgeon: Stark Klein, MD;  Location: [facility];  Service: General;  Laterality: Right;  53 MINUTES ROOM 2   [place]  2012   MENISCUS REPAIR Right 2001    SOCIAL HISTORY: Social History   Socioeconomic History   Marital status: Married    Spouse name: Not on file   Number of children: Not on file   Years of education: Not on file   Highest education level: Not on file  Occupational History   Not on file  Tobacco Use   Smoking status: Never    Passive exposure: Never   Smokeless tobacco: Never  Substance and Sexual Activity   Alcohol use: Yes    Comment: Social   Drug use: Not on file   Sexual activity: Yes  Other Topics Concern   Not on file  Social History Narrative   Lives at home with husband.     Social Determinants of Health   Financial Resource Strain: Not on file  Food Insecurity: Not on file  Transportation Needs: Not on file  Physical Activity: Not on file  Stress: Not on file  Social Connections: Not on file  Intimate Partner Violence: Not on file    FAMILY HISTORY: Family History  Problem Relation Age of Onset   Esophageal cancer Mother    Stomach  cancer Father    Heart attack Father 51   Heart attack Brother 43   Prostate cancer Paternal Grandfather     ALLERGIES:  has No Known Allergies.  MEDICATIONS:  Current Outpatient Medications  Medication Sig Dispense Refill   albuterol (VENTOLIN HFA) 108 (90 Base) MCG/ACT inhaler Inhale 1-2 puffs into the lungs every 4 (four) hours as needed for wheezing or shortness of breath.     amLODipine (NORVASC) 5 MG tablet Take 5 mg by mouth daily.     Ascorbic Acid (VITAMIN C) 100 MG tablet Take 100 mg by mouth daily.     Cholecalciferol (VITAMIN D3)  50 MCG (2000 UT) CAPS Take 2,000 Units by mouth daily.     Cyanocobalamin (B-12) 100 MCG TABS Take 100 mcg by mouth daily.     fluticasone-salmeterol (ADVAIR DISKUS) 250-50 MCG/ACT AEPB Inhale 1 puff into the lungs in the morning and at bedtime. 60 each 5   ipratropium (ATROVENT) 0.06 % nasal spray Place 2 sprays into both nostrils 4 (four) times daily. 15 mL 1   latanoprost (XALATAN) 0.005 % ophthalmic solution Place 1 drop into both eyes at bedtime.     omeprazole (PRILOSEC) 40 MG capsule Take 40 mg by mouth daily.     traZODone (DESYREL) 50 MG tablet Take 50 mg by mouth at bedtime as needed for sleep.     erythromycin ophthalmic ointment Place 1 application into the right eye in the morning, at noon, in the evening, and at bedtime. (Patient not taking: Reported on 04/05/2021)     fluticasone (FLONASE) 50 MCG/ACT nasal spray Place 1 spray into both nostrils daily. (Patient not taking: Reported on 04/05/2021) 16 g 2   oxyCODONE (OXY IR/ROXICODONE) 5 MG immediate release tablet Take 1 tablet (5 mg total) by mouth every 6 (six) hours as needed for severe pain. (Patient not taking: Reported on 04/05/2021) 5 tablet 0   No current facility-administered medications for this visit.    PHYSICAL EXAMINATION: ECOG PERFORMANCE STATUS: 0 - Asymptomatic  Vitals:   04/05/21 1003  BP: (!) 145/84  Pulse: 89  Resp: 17  Temp: 98.6 F (37 C)  SpO2: 98%   Filed Weights   04/05/21 1003  Weight: 161 lb 3 oz (73.1 kg)    GENERAL:alert, no distress and comfortable Musculoskeletal: No lower extremity edema  PSYCH: alert & oriented x 3 with fluent speech NEURO: no focal motor/sensory deficits BREAST: Right breast lumpectomy scar appears to be well-healing, Steri-Strips in place.    LABORATORY DATA:  I have reviewed the data as listed Lab Results  Component Value Date   WBC 9.8 03/26/2021   HGB 14.5 03/26/2021   HCT 46.0 03/26/2021   MCV 80.3 03/26/2021   PLT 251 03/26/2021   Lab Results   Component Value Date   NA 141 03/26/2021   K 3.3 (L) 03/26/2021   CL 102 03/26/2021   CO2 32 03/26/2021    RADIOGRAPHIC STUDIES:  I have personally reviewed the radiological reports and agreed with the findings in the report.  ASSESSMENT AND PLAN This is a 55 year old postmenopausal female patient with newly diagnosed right breast DCIS on screening mammogram status post right breast partial mastectomy referred to medical oncology and radiation oncology for adjuvant recommendations.  She is healthy at baseline besides hypertension.  We have discussed the following details today.  Pathology review: I discussed with the patient the difference between DCIS and invasive breast cancer. It is considered a precancerous lesion. DCIS is classified as  a Stage 0 breast cancer. It is generally detected through mammograms as calcifications. We discussed the significance of grades and its impact on prognosis. We also discussed the importance of ER and PR receptors and their implications to adjuvant treatment options. Prognosis of DCIS dependence on grade and degree of comedo necrosis. It is anticipated that if not treated, 20-30% of DCIS can develop into invasive breast cancer.  Recommendation: We recommended adjuvant radiation therapy and antiestrogen therapy, she already had right breast partial mastectomy.  Tamoxifen counseling: We discussed the risks and benefits of tamoxifen. These include but not limited to insomnia, hot flashes, mood changes, vaginal dryness, and weight gain. Although rare, serious side effects including endometrial cancer, risk of blood clots were also discussed. We strongly believe that the benefits far outweigh the risks. Patient understands these risks and consented to starting treatment. Planned treatment duration is 5 years.  Aromatase inhibitors counseling: We have discussed the mechanism of action of aromatase inhibitors today.  We have discussed adverse effects including  but not limited to menopausal symptoms, increased risk of osteoporosis and fractures, cardiovascular events, arthralgias and myalgias.  We do believe that the benefits far outweigh the risks.  Plan treatment duration of 5 years.  Given postmenopausal status, have discussed aromatase inhibitors as my choice for management.  She will return to clinic in mid March after completion of adjuvant radiation and we will proceed with aromatase inhibitors.  All her questions were answered to the best of my knowledge.  Thank you for consulting [place] the care of this patient.  Please do not hesitate to contact us with any additional questions or concerns.  I have spent a total of 60 minutes minutes of face-to-face and non-face-to-face time, preparing to see the patient, performing a medically appropriate examination, counseling and educating the patient, referring and communicating with other health care professionals, documenting clinical information in the electronic health record, and care coordination.  We have discussed the above-mentioned details.    Benay Pike, MD 04/05/21

## 2021-04-05 ENCOUNTER — Inpatient Hospital Stay: Payer: BC Managed Care – PPO | Attending: Hematology and Oncology | Admitting: Hematology and Oncology

## 2021-04-05 ENCOUNTER — Other Ambulatory Visit: Payer: Self-pay

## 2021-04-05 ENCOUNTER — Encounter: Payer: Self-pay | Admitting: Hematology and Oncology

## 2021-04-05 VITALS — BP 145/84 | HR 89 | Temp 98.6°F | Resp 17 | Wt 161.2 lb

## 2021-04-05 DIAGNOSIS — K219 Gastro-esophageal reflux disease without esophagitis: Secondary | ICD-10-CM | POA: Insufficient documentation

## 2021-04-05 DIAGNOSIS — I10 Essential (primary) hypertension: Secondary | ICD-10-CM | POA: Diagnosis not present

## 2021-04-05 DIAGNOSIS — Z8042 Family history of malignant neoplasm of prostate: Secondary | ICD-10-CM | POA: Insufficient documentation

## 2021-04-05 DIAGNOSIS — Z79899 Other long term (current) drug therapy: Secondary | ICD-10-CM | POA: Insufficient documentation

## 2021-04-05 DIAGNOSIS — E785 Hyperlipidemia, unspecified: Secondary | ICD-10-CM | POA: Diagnosis not present

## 2021-04-05 DIAGNOSIS — J45909 Unspecified asthma, uncomplicated: Secondary | ICD-10-CM | POA: Insufficient documentation

## 2021-04-05 DIAGNOSIS — Z800 Family history of malignant neoplasm of digestive organs: Secondary | ICD-10-CM | POA: Insufficient documentation

## 2021-04-05 DIAGNOSIS — D0511 Intraductal carcinoma in situ of right breast: Secondary | ICD-10-CM | POA: Diagnosis not present

## 2021-04-10 ENCOUNTER — Encounter: Payer: Self-pay | Admitting: *Deleted

## 2021-04-13 DIAGNOSIS — J4551 Severe persistent asthma with (acute) exacerbation: Secondary | ICD-10-CM | POA: Insufficient documentation

## 2021-04-23 NOTE — Progress Notes (Signed)
Radiation Oncology         [phone] ________________________________  Name: Alexandra Archer        MRN: [id number]  Date of Service: 04/26/2021 DOB: [birth year]  HY:[id number], [name], PA  Stark Klein, MD     REFERRING PHYSICIAN: Stark Klein, MD   DIAGNOSIS: The encounter diagnosis was Ductal carcinoma in situ (DCIS) of right breast.   HISTORY OF PRESENT ILLNESS: Alexandra Archer is a 56 y.o. female with a diagnosis of right breast cancer. The patient was found to have a screening detected right breast calcifications and diagnostic imaging measured a group of calcifications up to 1.4 cm in the upper outer quadrant of the right breast. A cyst measuring 7 mm was also seen in the 1:00 position and a stereotactic biopsy on 11/11 showed a high grade DCIS that was ER positive, PR negative.   Since her last visit, she underwent right lumpectomy on 03/29/21  that revealed a 1.3 cm high grade DCIS and all margins were negative. She's seen to discuss adjuvant radiotherapy.   PREVIOUS RADIATION THERAPY: No   PAST MEDICAL HISTORY:  Past Medical History:  Diagnosis Date   Anemia    Anxiety    Asthma    Breast cancer ([place]) 02/23/2021   GERD (gastroesophageal reflux disease)    Glaucoma    Goiter    Hyperlipidemia    Primary hypertension    Seasonal allergic rhinitis        PAST SURGICAL HISTORY: Past Surgical History:  Procedure Laterality Date   BREAST LUMPECTOMY WITH RADIOACTIVE SEED LOCALIZATION Right 03/29/2021   Procedure: RIGHT BREAST LUMPECTOMY WITH RADIOACTIVE SEED LOCALIZATION;  Surgeon: Stark Klein, MD;  Location: [facility];  Service: General;  Laterality: Right;  48 MINUTES ROOM 2   [place]  2012   MENISCUS REPAIR Right 2001     FAMILY HISTORY:  Family History  Problem Relation Age of Onset   Esophageal cancer Mother    Stomach cancer Father    Heart attack Father 18   Heart attack Brother 5   Prostate cancer  Paternal Grandfather      SOCIAL HISTORY:  reports that she has never smoked. She has never been exposed to tobacco smoke. She has never used smokeless tobacco. She reports current alcohol use. The patient is married and lives in [place]. Her husband  underwent heart transplant earlier this year and their first grandchild is due to be born in the next week. They plan to travel to [place] to see the baby.    ALLERGIES: Patient has no known allergies.   MEDICATIONS:  Current Outpatient Medications  Medication Sig Dispense Refill   albuterol (VENTOLIN HFA) 108 (90 Base) MCG/ACT inhaler Inhale 1-2 puffs into the lungs every 4 (four) hours as needed for wheezing or shortness of breath.     amLODipine (NORVASC) 5 MG tablet Take 5 mg by mouth daily.     Ascorbic Acid (VITAMIN C) 100 MG tablet Take 100 mg by mouth daily.     Cholecalciferol (VITAMIN D3) 50 MCG (2000 UT) CAPS Take 2,000 Units by mouth daily.     Cyanocobalamin (B-12) 100 MCG TABS Take 100 mcg by mouth daily.     erythromycin ophthalmic ointment Place 1 application into the right eye in the morning, at noon, in the evening, and at bedtime. (Patient not taking: Reported on 04/05/2021)     fluticasone (FLONASE) 50 MCG/ACT nasal spray Place 1  spray into both nostrils daily. (Patient not taking: Reported on 04/05/2021) 16 g 2   fluticasone-salmeterol (ADVAIR DISKUS) 250-50 MCG/ACT AEPB Inhale 1 puff into the lungs in the morning and at bedtime. 60 each 5   ipratropium (ATROVENT) 0.06 % nasal spray Place 2 sprays into both nostrils 4 (four) times daily. 15 mL 1   latanoprost (XALATAN) 0.005 % ophthalmic solution Place 1 drop into both eyes at bedtime.     omeprazole (PRILOSEC) 40 MG capsule Take 40 mg by mouth daily.     oxyCODONE (OXY IR/ROXICODONE) 5 MG immediate release tablet Take 1 tablet (5 mg total) by mouth every 6 (six) hours as needed for severe pain. (Patient not taking: Reported on 04/05/2021) 5 tablet 0   traZODone  (DESYREL) 50 MG tablet Take 50 mg by mouth at bedtime as needed for sleep.     No current facility-administered medications for this visit.     REVIEW OF SYSTEMS: On review of systems, the patient reports that she is doing well but has had some fullness in her right breast and bruising as well as soreness below the fullness along the right rib line. No other complaints are verbalized.      PHYSICAL EXAM:  Wt Readings from Last 3 Encounters:  04/05/21 161 lb 3 oz (73.1 kg)  03/29/21 160 lb (72.6 kg)  03/26/21 160 lb 11.2 oz (72.9 kg)   Temp Readings from Last 3 Encounters:  04/05/21 98.6 F (37 C)  03/29/21 (!) 97.5 F (36.4 C)  03/26/21 98.3 F (36.8 C) (Oral)   BP Readings from Last 3 Encounters:  04/05/21 (!) 145/84  03/29/21 (!) 162/94  03/26/21 (!) 157/95   Pulse Readings from Last 3 Encounters:  04/05/21 89  03/29/21 88  03/26/21 73    In general this is a well appearing African American female in no acute distress. She's alert and oriented x4 and appropriate throughout the examination. Cardiopulmonary assessment is negative for acute distress and she exhibits normal effort. Her right breast incision is well healed without erythema, separation or drainage. She does however have a large hematoma, approximately 7 cm in greatest dimension of the right breast with is significantly larger overall in breast size compared to her left breast.     ECOG = 1  0 - Asymptomatic (Fully active, able to carry on all predisease activities without restriction)  1 - Symptomatic but completely ambulatory (Restricted in physically strenuous activity but ambulatory and able to carry out work of a light or sedentary nature. For example, light housework, office work)  2 - Symptomatic, <50% in bed during the day (Ambulatory and capable of all self care but unable to carry out any work activities. Up and about more than 50% of waking hours)  3 - Symptomatic, >50% in bed, but not bedbound  (Capable of only limited self-care, confined to bed or chair 50% or more of waking hours)  4 - Bedbound (Completely disabled. Cannot carry on any self-care. Totally confined to bed or chair)  5 - Death   Eustace Pen MM, Creech RH, Tormey DC, et al. [phone]). "Toxicity and response criteria of the [facility]". [place] Oncol. 5 (6): 649-55    LABORATORY DATA:  Lab Results  Component Value Date   WBC 9.8 03/26/2021   HGB 14.5 03/26/2021   HCT 46.0 03/26/2021   MCV 80.3 03/26/2021   PLT 251 03/26/2021   Lab Results  Component Value Date   NA 141 03/26/2021  K 3.3 (L) 03/26/2021   CL 102 03/26/2021   CO2 32 03/26/2021   No results found for: ALT, AST, GGT, ALKPHOS, BILITOT    RADIOGRAPHY: MM Breast Surgical Specimen  Result Date: 03/29/2021 CLINICAL DATA:  Evaluate specimen after radioactive seed localization of a right breast lesion. EXAM: SPECIMEN RADIOGRAPH OF THE RIGHT BREAST COMPARISON:  Previous exam(s). FINDINGS: Status post excision of the right breast. The radioactive seed and biopsy marker clip are present, completely intact, and were marked for pathology. IMPRESSION: Specimen radiograph of the right breast. Electronically Signed   By: Lajean Manes M.D.   On: 03/29/2021 12:50  MM RT RADIOACTIVE SEED LOC MAMMO GUIDE  Result Date: 03/28/2021 CLINICAL DATA:  56 year old with biopsy-proven high-grade DCIS involving the outer RIGHT breast at far posterior depth, slight UPPER OUTER QUADRANT (X clip). Radioactive seed localization is performed in anticipation of lumpectomy tomorrow. EXAM: MAMMOGRAPHIC GUIDED RADIOACTIVE SEED LOCALIZATION OF THE RIGHT BREAST COMPARISON:  Previous exam(s). FINDINGS: Patient presents for radioactive seed localization prior to RIGHT breast lumpectomy. I met with the patient and we discussed the procedure of seed localization including benefits and alternatives. We discussed the high likelihood of a successful procedure. We  discussed the risks of the procedure including infection, bleeding, tissue injury and further surgery. We discussed the low dose of radioactivity involved in the procedure. Informed, written consent was given. The usual time-out protocol was performed immediately prior to the procedure. Using mammographic guidance, sterile technique with chlorhexidine as skin antisepsis, 1% lidocaine as local anesthetic, an I-125 radioactive seed was used to localize the X shaped tissue marker clip associated with the calcifications of biopsy-proven DCIS using a superior approach. The follow-up mammogram images confirm that the seed is located approximately 1 cm directly superior to the X clip which is at the anterior margin of the calcifications. The images are marked and annotated for Dr. Barry Dienes. Follow-up survey of the patient confirms the presence of the radioactive seed. Order number of I-125 seed: [id number] Total activity: 0.253 mCi Reference Date: 03/15/2021 The patient tolerated the procedure well and was released from the [place]. She was given instructions regarding seed removal. IMPRESSION: Radioactive seed localization of biopsy-proven DCIS involving the outer RIGHT breast at far posterior depth breast. No apparent complications. The seed is approximately 1 cm directly superior to the X clip which is located at the anterior margin of the biopsied calcifications. The calcifications extend posteriorly to the chest wall. Electronically Signed   By: Evangeline Dakin M.D.   On: 03/28/2021 15:19      IMPRESSION/PLAN: 1. High Grade, ER positive DCIS of the right breast. Dr. Lisbeth Renshaw discusses the final pathology findings and reviews the nature of early stage breast disease. She has done well since surgery, and would benefit from external radiotherapy to the breast  to reduce risks of local recurrence followed by antiestrogen therapy. We discussed the risks, benefits, short, and long term effects of radiotherapy, as well  as the curative intent, and the patient is interested in proceeding. Dr. Lisbeth Renshaw discusses the delivery and logistics of radiotherapy and recommends 4 weeks of radiotherapy to the right breast. Written consent is obtained and placed in the chart, a copy was provided to the patient. With her large hematoma I encouraged her to delay simulation to 05/14/21 which we moved on the schedule. She was also encouraged to wear a tight compressive style sports bra as much as she can tolerate during the day and night for the next week, as well  as NSAIDs and a heating pad. If this hasn't improved some with these measures, we can set her up to see Physical Therapy. She is in agreement.   In a visit lasting 45 minutes, greater than 50% of the time was spent face to face reviewing her case, as well as in preparation of, discussing, and coordinating the patient's care.  The above documentation reflects my direct findings during this shared patient visit. Please see the separate note by Dr. Lisbeth Renshaw on this date for the remainder of the patient's plan of care.    Carola Rhine, [facility]    **Disclaimer: This note was dictated with voice recognition software. Similar sounding words can inadvertently be transcribed and this note may contain transcription errors which may not have been corrected upon publication of note.**

## 2021-04-25 ENCOUNTER — Telehealth: Payer: Self-pay

## 2021-04-25 NOTE — Telephone Encounter (Signed)
Spoke w/ patient and verified identity. Reminded patient of 8:00am-04/26/21 "in-person" appointment w/ Shona Simpson, PA-C. Advised patient to arrive 24min early for check-in. I left my extension [phone] for patient to call if needed. Patient verbalized understanding of information given.

## 2021-04-26 ENCOUNTER — Ambulatory Visit: Payer: Commercial Managed Care - PPO | Admitting: Radiation Oncology

## 2021-04-26 ENCOUNTER — Ambulatory Visit: Payer: 59 | Admitting: Radiation Oncology

## 2021-04-26 ENCOUNTER — Ambulatory Visit
Admission: RE | Admit: 2021-04-26 | Discharge: 2021-04-26 | Disposition: A | Discharge status: Home / Self Care | Payer: 59 | Source: Ambulatory Visit | Attending: Radiation Oncology | Admitting: Radiation Oncology

## 2021-04-26 ENCOUNTER — Other Ambulatory Visit: Payer: Self-pay

## 2021-04-26 ENCOUNTER — Encounter: Payer: Self-pay | Admitting: Radiation Oncology

## 2021-04-26 VITALS — BP 131/83 | HR 69 | Temp 97.3°F | Resp 18 | Ht 67.0 in | Wt 161.0 lb

## 2021-04-26 DIAGNOSIS — Z170 Estrogen receptor positive status [ER+]: Secondary | ICD-10-CM | POA: Insufficient documentation

## 2021-04-26 DIAGNOSIS — Z7951 Long term (current) use of inhaled steroids: Secondary | ICD-10-CM | POA: Insufficient documentation

## 2021-04-26 DIAGNOSIS — Z87891 Personal history of nicotine dependence: Secondary | ICD-10-CM | POA: Insufficient documentation

## 2021-04-26 DIAGNOSIS — D0511 Intraductal carcinoma in situ of right breast: Secondary | ICD-10-CM | POA: Diagnosis present

## 2021-04-26 DIAGNOSIS — Z800 Family history of malignant neoplasm of digestive organs: Secondary | ICD-10-CM | POA: Diagnosis not present

## 2021-04-26 DIAGNOSIS — K219 Gastro-esophageal reflux disease without esophagitis: Secondary | ICD-10-CM | POA: Insufficient documentation

## 2021-04-26 DIAGNOSIS — J45909 Unspecified asthma, uncomplicated: Secondary | ICD-10-CM | POA: Diagnosis not present

## 2021-04-26 DIAGNOSIS — Z8042 Family history of malignant neoplasm of prostate: Secondary | ICD-10-CM | POA: Insufficient documentation

## 2021-04-26 DIAGNOSIS — I10 Essential (primary) hypertension: Secondary | ICD-10-CM | POA: Insufficient documentation

## 2021-04-26 DIAGNOSIS — E785 Hyperlipidemia, unspecified: Secondary | ICD-10-CM | POA: Diagnosis not present

## 2021-04-26 DIAGNOSIS — Z79899 Other long term (current) drug therapy: Secondary | ICD-10-CM | POA: Insufficient documentation

## 2021-04-26 NOTE — Progress Notes (Addendum)
Verified patient identity and begin nursing interview. Patient reports RT breast tenderness 3/10 and mild insomnia (managed). No other symptoms reported at this time.  Meaningful use complete. Tubal ligation/ postmenopausal- NO chances of pregnancy.  BP 131/83 (BP Location: Left Arm, Patient Position: Sitting, Cuff Size: Normal)    Pulse 69    Temp (!) 97.3 F (36.3 C) (Temporal)    Resp 18    Ht 5\' 7"  (1.702 m)    Wt 161 lb (73 kg)    SpO2 100%    BMI 25.22 kg/m

## 2021-05-01 ENCOUNTER — Other Ambulatory Visit: Payer: Self-pay | Admitting: Pulmonary Disease

## 2021-05-03 ENCOUNTER — Encounter: Payer: Self-pay | Admitting: *Deleted

## 2021-05-14 ENCOUNTER — Other Ambulatory Visit: Payer: Self-pay

## 2021-05-14 ENCOUNTER — Ambulatory Visit
Admission: RE | Admit: 2021-05-14 | Discharge: 2021-05-14 | Disposition: A | Discharge status: Home / Self Care | Payer: 59 | Source: Ambulatory Visit | Attending: Radiation Oncology | Admitting: Radiation Oncology

## 2021-05-14 DIAGNOSIS — D0511 Intraductal carcinoma in situ of right breast: Secondary | ICD-10-CM | POA: Insufficient documentation

## 2021-05-14 DIAGNOSIS — Z170 Estrogen receptor positive status [ER+]: Secondary | ICD-10-CM | POA: Insufficient documentation

## 2021-05-14 DIAGNOSIS — Z510 Encounter for antineoplastic radiation therapy: Secondary | ICD-10-CM | POA: Diagnosis not present

## 2021-05-16 DIAGNOSIS — Z510 Encounter for antineoplastic radiation therapy: Secondary | ICD-10-CM | POA: Diagnosis not present

## 2021-05-16 DIAGNOSIS — Z170 Estrogen receptor positive status [ER+]: Secondary | ICD-10-CM | POA: Insufficient documentation

## 2021-05-16 DIAGNOSIS — D0511 Intraductal carcinoma in situ of right breast: Secondary | ICD-10-CM | POA: Diagnosis not present

## 2021-05-17 ENCOUNTER — Ambulatory Visit: Payer: BC Managed Care – PPO | Admitting: Student

## 2021-05-21 ENCOUNTER — Encounter: Payer: Self-pay | Admitting: *Deleted

## 2021-05-21 ENCOUNTER — Other Ambulatory Visit: Payer: Self-pay

## 2021-05-21 ENCOUNTER — Ambulatory Visit
Admission: RE | Admit: 2021-05-21 | Discharge: 2021-05-21 | Disposition: A | Discharge status: Home / Self Care | Payer: 59 | Source: Ambulatory Visit | Attending: Radiation Oncology | Admitting: Radiation Oncology

## 2021-05-21 DIAGNOSIS — D0511 Intraductal carcinoma in situ of right breast: Secondary | ICD-10-CM | POA: Diagnosis not present

## 2021-05-22 ENCOUNTER — Ambulatory Visit
Admission: RE | Admit: 2021-05-22 | Discharge: 2021-05-22 | Disposition: A | Discharge status: Home / Self Care | Payer: 59 | Source: Ambulatory Visit | Attending: Radiation Oncology | Admitting: Radiation Oncology

## 2021-05-22 DIAGNOSIS — D0511 Intraductal carcinoma in situ of right breast: Secondary | ICD-10-CM | POA: Diagnosis not present

## 2021-05-23 ENCOUNTER — Other Ambulatory Visit: Payer: Self-pay

## 2021-05-23 ENCOUNTER — Ambulatory Visit
Admission: RE | Admit: 2021-05-23 | Discharge: 2021-05-23 | Disposition: A | Discharge status: Home / Self Care | Payer: 59 | Source: Ambulatory Visit | Attending: Radiation Oncology | Admitting: Radiation Oncology

## 2021-05-23 DIAGNOSIS — D0511 Intraductal carcinoma in situ of right breast: Secondary | ICD-10-CM | POA: Diagnosis not present

## 2021-05-24 ENCOUNTER — Other Ambulatory Visit: Payer: Self-pay

## 2021-05-24 ENCOUNTER — Ambulatory Visit
Admission: RE | Admit: 2021-05-24 | Discharge: 2021-05-24 | Disposition: A | Discharge status: Home / Self Care | Payer: 59 | Source: Ambulatory Visit | Attending: Radiation Oncology | Admitting: Radiation Oncology

## 2021-05-24 DIAGNOSIS — D0511 Intraductal carcinoma in situ of right breast: Secondary | ICD-10-CM | POA: Diagnosis not present

## 2021-05-24 NOTE — Progress Notes (Signed)
Pt here for patient teaching.  Pt given Radiation and You booklet, skin care instructions, and Sonafine.  Patient was advised to obtain over the counter aluminum free deodorant.  Reviewed areas of pertinence such as fatigue, hair loss, skin changes, breast tenderness, and breast swelling . Pt able to give teach back of to pat skin and use unscented/gentle soap,apply Sonafine bid, avoid applying anything to skin within 4 hours of treatment, avoid wearing an under wire bra, and to use an electric razor if they must shave. Pt verbalizes understanding of information given and will contact nursing with any questions or concerns.

## 2021-05-25 ENCOUNTER — Other Ambulatory Visit: Payer: Self-pay

## 2021-05-25 ENCOUNTER — Ambulatory Visit
Admission: RE | Admit: 2021-05-25 | Discharge: 2021-05-25 | Disposition: A | Discharge status: Home / Self Care | Payer: 59 | Source: Ambulatory Visit | Attending: Radiation Oncology | Admitting: Radiation Oncology

## 2021-05-25 DIAGNOSIS — D0511 Intraductal carcinoma in situ of right breast: Secondary | ICD-10-CM

## 2021-05-25 MED ORDER — SONAFINE EX EMUL
1.0000 "application " | Freq: Once | CUTANEOUS | Status: AC
  Administered 2021-05-25: 1 via TOPICAL

## 2021-05-28 ENCOUNTER — Encounter: Payer: Self-pay | Admitting: Radiation Oncology

## 2021-05-28 ENCOUNTER — Ambulatory Visit
Admission: RE | Admit: 2021-05-28 | Discharge: 2021-05-28 | Disposition: A | Discharge status: Home / Self Care | Payer: 59 | Source: Ambulatory Visit | Attending: Radiation Oncology | Admitting: Radiation Oncology

## 2021-05-28 DIAGNOSIS — D0511 Intraductal carcinoma in situ of right breast: Secondary | ICD-10-CM | POA: Diagnosis not present

## 2021-05-29 ENCOUNTER — Other Ambulatory Visit: Payer: Self-pay

## 2021-05-29 ENCOUNTER — Ambulatory Visit
Admission: RE | Admit: 2021-05-29 | Discharge: 2021-05-29 | Disposition: A | Discharge status: Home / Self Care | Payer: 59 | Source: Ambulatory Visit | Attending: Radiation Oncology | Admitting: Radiation Oncology

## 2021-05-29 DIAGNOSIS — D0511 Intraductal carcinoma in situ of right breast: Secondary | ICD-10-CM | POA: Diagnosis not present

## 2021-05-30 ENCOUNTER — Other Ambulatory Visit: Payer: Self-pay

## 2021-05-30 ENCOUNTER — Ambulatory Visit
Admission: RE | Admit: 2021-05-30 | Discharge: 2021-05-30 | Disposition: A | Discharge status: Home / Self Care | Payer: 59 | Source: Ambulatory Visit | Attending: Radiation Oncology | Admitting: Radiation Oncology

## 2021-05-30 DIAGNOSIS — D0511 Intraductal carcinoma in situ of right breast: Secondary | ICD-10-CM | POA: Diagnosis not present

## 2021-05-31 ENCOUNTER — Ambulatory Visit
Admission: RE | Admit: 2021-05-31 | Discharge: 2021-05-31 | Disposition: A | Discharge status: Home / Self Care | Payer: 59 | Source: Ambulatory Visit | Attending: Radiation Oncology | Admitting: Radiation Oncology

## 2021-05-31 DIAGNOSIS — D0511 Intraductal carcinoma in situ of right breast: Secondary | ICD-10-CM | POA: Diagnosis not present

## 2021-06-01 ENCOUNTER — Ambulatory Visit
Admission: RE | Admit: 2021-06-01 | Discharge: 2021-06-01 | Disposition: A | Discharge status: Home / Self Care | Payer: 59 | Source: Ambulatory Visit | Attending: Radiation Oncology | Admitting: Radiation Oncology

## 2021-06-01 ENCOUNTER — Other Ambulatory Visit: Payer: Self-pay

## 2021-06-01 DIAGNOSIS — D0511 Intraductal carcinoma in situ of right breast: Secondary | ICD-10-CM | POA: Diagnosis not present

## 2021-06-04 ENCOUNTER — Ambulatory Visit
Admission: RE | Admit: 2021-06-04 | Discharge: 2021-06-04 | Disposition: A | Discharge status: Home / Self Care | Payer: 59 | Source: Ambulatory Visit | Attending: Radiation Oncology | Admitting: Radiation Oncology

## 2021-06-04 ENCOUNTER — Other Ambulatory Visit: Payer: Self-pay

## 2021-06-04 DIAGNOSIS — D0511 Intraductal carcinoma in situ of right breast: Secondary | ICD-10-CM | POA: Diagnosis not present

## 2021-06-05 ENCOUNTER — Ambulatory Visit
Admission: RE | Admit: 2021-06-05 | Discharge: 2021-06-05 | Disposition: A | Discharge status: Home / Self Care | Payer: 59 | Source: Ambulatory Visit | Attending: Radiation Oncology | Admitting: Radiation Oncology

## 2021-06-05 ENCOUNTER — Other Ambulatory Visit: Payer: Self-pay

## 2021-06-05 DIAGNOSIS — D0511 Intraductal carcinoma in situ of right breast: Secondary | ICD-10-CM | POA: Diagnosis not present

## 2021-06-06 ENCOUNTER — Ambulatory Visit
Admission: RE | Admit: 2021-06-06 | Discharge: 2021-06-06 | Disposition: A | Discharge status: Home / Self Care | Payer: 59 | Source: Ambulatory Visit | Attending: Radiation Oncology | Admitting: Radiation Oncology

## 2021-06-06 DIAGNOSIS — D0511 Intraductal carcinoma in situ of right breast: Secondary | ICD-10-CM | POA: Diagnosis not present

## 2021-06-07 ENCOUNTER — Ambulatory Visit
Admission: RE | Admit: 2021-06-07 | Discharge: 2021-06-07 | Disposition: A | Discharge status: Home / Self Care | Payer: 59 | Source: Ambulatory Visit | Attending: Radiation Oncology | Admitting: Radiation Oncology

## 2021-06-07 ENCOUNTER — Other Ambulatory Visit: Payer: Self-pay

## 2021-06-07 DIAGNOSIS — D0511 Intraductal carcinoma in situ of right breast: Secondary | ICD-10-CM | POA: Diagnosis not present

## 2021-06-08 ENCOUNTER — Ambulatory Visit: Payer: 59 | Admitting: Radiation Oncology

## 2021-06-08 ENCOUNTER — Ambulatory Visit
Admission: RE | Admit: 2021-06-08 | Discharge: 2021-06-08 | Disposition: A | Discharge status: Home / Self Care | Payer: 59 | Source: Ambulatory Visit | Attending: Radiation Oncology | Admitting: Radiation Oncology

## 2021-06-08 DIAGNOSIS — D0511 Intraductal carcinoma in situ of right breast: Secondary | ICD-10-CM | POA: Diagnosis not present

## 2021-06-10 DIAGNOSIS — D0511 Intraductal carcinoma in situ of right breast: Secondary | ICD-10-CM | POA: Diagnosis not present

## 2021-06-11 ENCOUNTER — Other Ambulatory Visit: Payer: Self-pay

## 2021-06-11 ENCOUNTER — Ambulatory Visit
Admission: RE | Admit: 2021-06-11 | Discharge: 2021-06-11 | Disposition: A | Discharge status: Home / Self Care | Payer: 59 | Source: Ambulatory Visit | Attending: Radiation Oncology | Admitting: Radiation Oncology

## 2021-06-11 DIAGNOSIS — D0511 Intraductal carcinoma in situ of right breast: Secondary | ICD-10-CM | POA: Diagnosis not present

## 2021-06-12 ENCOUNTER — Ambulatory Visit
Admission: RE | Admit: 2021-06-12 | Discharge: 2021-06-12 | Disposition: A | Discharge status: Home / Self Care | Payer: 59 | Source: Ambulatory Visit | Attending: Radiation Oncology | Admitting: Radiation Oncology

## 2021-06-12 DIAGNOSIS — D0511 Intraductal carcinoma in situ of right breast: Secondary | ICD-10-CM | POA: Diagnosis not present

## 2021-06-13 ENCOUNTER — Other Ambulatory Visit: Payer: Self-pay

## 2021-06-13 ENCOUNTER — Ambulatory Visit
Admission: RE | Admit: 2021-06-13 | Discharge: 2021-06-13 | Disposition: A | Discharge status: Home / Self Care | Payer: 59 | Source: Ambulatory Visit | Attending: Radiation Oncology | Admitting: Radiation Oncology

## 2021-06-13 DIAGNOSIS — D0511 Intraductal carcinoma in situ of right breast: Secondary | ICD-10-CM | POA: Diagnosis not present

## 2021-06-13 DIAGNOSIS — Z510 Encounter for antineoplastic radiation therapy: Secondary | ICD-10-CM | POA: Diagnosis not present

## 2021-06-13 DIAGNOSIS — Z170 Estrogen receptor positive status [ER+]: Secondary | ICD-10-CM | POA: Insufficient documentation

## 2021-06-14 ENCOUNTER — Encounter: Payer: Self-pay | Admitting: *Deleted

## 2021-06-14 ENCOUNTER — Ambulatory Visit
Admission: RE | Admit: 2021-06-14 | Discharge: 2021-06-14 | Disposition: A | Discharge status: Home / Self Care | Payer: 59 | Source: Ambulatory Visit | Attending: Radiation Oncology | Admitting: Radiation Oncology

## 2021-06-14 DIAGNOSIS — D0511 Intraductal carcinoma in situ of right breast: Secondary | ICD-10-CM

## 2021-06-15 ENCOUNTER — Encounter: Payer: Self-pay | Admitting: Radiation Oncology

## 2021-06-15 ENCOUNTER — Ambulatory Visit
Admission: RE | Admit: 2021-06-15 | Discharge: 2021-06-15 | Disposition: A | Discharge status: Home / Self Care | Payer: 59 | Source: Ambulatory Visit | Attending: Radiation Oncology | Admitting: Radiation Oncology

## 2021-06-15 ENCOUNTER — Other Ambulatory Visit: Payer: Self-pay

## 2021-06-15 DIAGNOSIS — D0511 Intraductal carcinoma in situ of right breast: Secondary | ICD-10-CM | POA: Diagnosis not present

## 2021-06-20 ENCOUNTER — Encounter (HOSPITAL_COMMUNITY): Payer: Self-pay

## 2021-06-21 ENCOUNTER — Telehealth: Payer: Self-pay | Admitting: Hematology and Oncology

## 2021-06-21 NOTE — Progress Notes (Signed)
? ?                                                                                                                                                          ?  Patient Name: Alexandra Archer ?MRN: [id number] ?DOB: [birth year] ?Referring Physician: Thayer Ohm M ?Date of Service: 06/15/2021 ?[facility], [place] ? ?                                                      End Of Treatment Note ? ?Diagnoses: D05.11-Intraductal carcinoma in situ of right breast ? ?Cancer Staging:  High Grade, ER positive DCIS of the right breast ? ?Intent: Curative ? ?Radiation Treatment Dates: 05/21/2021 through 06/15/2021 ?Site Technique Total Dose (Gy) Dose per Fx (Gy) Completed Fx Beam Energies  ?Breast, Right: Breast_R 3D 42.56/42.56 2.66 16/16 6X, 10X  ?Breast, Right: Breast_R_Bst 3D 8/8 2 4/4 6X, 10X  ? ?Narrative: The patient tolerated radiation therapy relatively well. She developed anticipated skin changes in the treatment field.  ? ?Plan: The patient will receive a call in about one month from the radiation oncology department. She will continue follow up with Dr. Chryl Heck as well.  ? ?________________________________________________ ? ? ? ?Carola Rhine, PAC  ?

## 2021-06-21 NOTE — Telephone Encounter (Signed)
Called to inform patient of the changes made to her upcoming appointment. Patient is aware of these changes. ?

## 2021-06-23 ENCOUNTER — Other Ambulatory Visit: Payer: Self-pay | Admitting: Pulmonary Disease

## 2021-06-27 ENCOUNTER — Inpatient Hospital Stay: Payer: 59 | Attending: Hematology and Oncology | Admitting: Hematology and Oncology

## 2021-06-27 ENCOUNTER — Other Ambulatory Visit: Payer: Self-pay

## 2021-06-27 ENCOUNTER — Encounter: Payer: Self-pay | Admitting: Hematology and Oncology

## 2021-06-27 VITALS — BP 144/79 | HR 78 | Temp 97.7°F | Resp 18 | Ht 67.0 in | Wt 161.2 lb

## 2021-06-27 DIAGNOSIS — F419 Anxiety disorder, unspecified: Secondary | ICD-10-CM | POA: Diagnosis not present

## 2021-06-27 DIAGNOSIS — E785 Hyperlipidemia, unspecified: Secondary | ICD-10-CM | POA: Diagnosis not present

## 2021-06-27 DIAGNOSIS — Z170 Estrogen receptor positive status [ER+]: Secondary | ICD-10-CM | POA: Insufficient documentation

## 2021-06-27 DIAGNOSIS — Z923 Personal history of irradiation: Secondary | ICD-10-CM | POA: Insufficient documentation

## 2021-06-27 DIAGNOSIS — I10 Essential (primary) hypertension: Secondary | ICD-10-CM | POA: Diagnosis not present

## 2021-06-27 DIAGNOSIS — K219 Gastro-esophageal reflux disease without esophagitis: Secondary | ICD-10-CM | POA: Diagnosis not present

## 2021-06-27 DIAGNOSIS — Z79899 Other long term (current) drug therapy: Secondary | ICD-10-CM | POA: Insufficient documentation

## 2021-06-27 DIAGNOSIS — D0511 Intraductal carcinoma in situ of right breast: Secondary | ICD-10-CM | POA: Diagnosis not present

## 2021-06-27 MED ORDER — ANASTROZOLE 1 MG PO TABS: 1.0000 mg | ORAL_TABLET | Freq: Every day | ORAL | 2 refills | Status: DC

## 2021-06-27 NOTE — Progress Notes (Signed)
[name] ?CONSULT NOTE ? ?Patient Care Team: ?[name], PA as PCP - General (Internal Medicine) ?[name], RN as Oncology Nurse Navigator ?[name], RN as Oncology Nurse Navigator ? ?CHIEF COMPLAINTS/PURPOSE OF CONSULTATION:  ?Newly diagnosed DCIS ? ?HISTORY OF PRESENTING ILLNESS:  ?Alexandra Archer 56 y.o. female is here because of recent diagnosis of right breast DCIS ? ?I reviewed her records extensively and collaborated the history with the patient. ? ?SUMMARY OF ONCOLOGIC HISTORY: ?Oncology History Overview Note  ?Nov 2022: She was found to have screen detected right breast calcifications.Diagnostic imaging confirmed this. There was a 1.4 cm group of coarse calcifications in the UOQ. ?There was a benign cyst at 1 o'clock 7 mm in greatest dimension. 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  ?She had a right breast lumpectomy by Dr. Barry Dienes on 03/29/2021 and pathology from this showed ductal carcinoma in situ, high-grade, 1.3 cm, margins uninvolved by carcinoma, prognostic showed ER positive, 95%, moderate, PR negative. ?  ?Ductal carcinoma in situ (DCIS) of right breast  ?03/14/2021 Initial Diagnosis  ? Ductal carcinoma in situ (DCIS) of right breast ?  ? ?2019,right breast biopsy, benign. ?2004, right breast fibroadenoma removed. ?She completed adjuvant radiation on 06/15/2021 ?She tolerated it well except for some skin blistering which is now healing.  No other concerning symptoms. ?Rest of the pertinent 10 point ROS reviewed and negative. ? ?MEDICAL HISTORY:  ?Past Medical History:  ?Diagnosis Date  ? Anemia   ? Anxiety   ? Asthma   ? Breast cancer ([place]) 02/23/2021  ? GERD (gastroesophageal reflux disease)   ? Glaucoma   ? Goiter   ? Hyperlipidemia   ? Primary hypertension   ? Seasonal allergic rhinitis   ? ? ?SURGICAL HISTORY: ?Past Surgical History:  ?Procedure Laterality Date  ? BREAST LUMPECTOMY WITH RADIOACTIVE SEED LOCALIZATION  Right 03/29/2021  ? Procedure: RIGHT BREAST LUMPECTOMY WITH RADIOACTIVE SEED LOCALIZATION;  Surgeon: Stark Klein, MD;  Location: [facility];  Service: General;  Laterality: Right;  90 MINUTES ROOM 2  ? [place]  ? LAPAROSCOPIC CHOLECYSTECTOMY  2012  ? MENISCUS REPAIR Right 2001  ? ? ?SOCIAL HISTORY: ?Social History  ? ?Socioeconomic History  ? Marital status: Married  ?  Spouse name: Not on file  ? Number of children: Not on file  ? Years of education: Not on file  ? Highest education level: Not on file  ?Occupational History  ? Not on file  ?Tobacco Use  ? Smoking status: Never  ?  Passive exposure: Never  ? Smokeless tobacco: Never  ?Substance and Sexual Activity  ? Alcohol use: Yes  ?  Comment: Social  ? Drug use: Not on file  ? Sexual activity: Yes  ?Other Topics Concern  ? Not on file  ?Social History Narrative  ? Lives at home with husband.    ? ?Social Determinants of Health  ? ?Financial Resource Strain: Not on file  ?Food Insecurity: Not on file  ?Transportation Needs: Not on file  ?Physical Activity: Not on file  ?Stress: Not on file  ?Social Connections: Not on file  ?Intimate Partner Violence: Not on file  ? ? ?FAMILY HISTORY: ?Family History  ?Problem Relation Age of Onset  ? Esophageal cancer Mother   ? Stomach cancer Father   ? Heart attack Father 22  ? Heart attack Brother 74  ? Prostate cancer Paternal Grandfather   ? ? ?ALLERGIES:  has No Known Allergies. ? ?MEDICATIONS:  ?Current Outpatient Medications  ?Medication Sig Dispense Refill  ? albuterol (VENTOLIN HFA) 108 (90 Base) MCG/ACT inhaler Inhale 1-2 puffs into the lungs every 4 (four) hours as needed for wheezing or shortness of breath.    ? amLODipine (NORVASC) 5 MG tablet Take 5 mg by mouth daily.    ? Ascorbic Acid (VITAMIN C) 100 MG tablet Take 100 mg by mouth daily.    ? Cholecalciferol (VITAMIN D3) 50 MCG (2000 UT) CAPS Take 2,000 Units by mouth daily.    ? Cyanocobalamin (B-12) 100 MCG TABS Take 100 mcg by mouth daily.    ?  fluticasone (FLONASE) 50 MCG/ACT nasal spray Place 1 spray into both nostrils daily. (Patient not taking: Reported on 04/05/2021) 16 g 2  ? fluticasone-salmeterol (ADVAIR DISKUS) 250-50 MCG/ACT AEPB Inhale 1 puff into the lungs in the morning and at bedtime. 60 each 5  ? ipratropium (ATROVENT) 0.06 % nasal spray Place 2 sprays into both nostrils 4 (four) times daily. 15 mL 1  ? latanoprost (XALATAN) 0.005 % ophthalmic solution Place 1 drop into both eyes at bedtime.    ? omeprazole (PRILOSEC) 40 MG capsule Take 40 mg by mouth daily.    ? traZODone (DESYREL) 50 MG tablet Take 50 mg by mouth at bedtime as needed for sleep.    ? ?No current facility-administered medications for this visit.  ? ? ?PHYSICAL EXAMINATION: ?ECOG PERFORMANCE STATUS: 0 - Asymptomatic ? ?Vitals:  ? 06/27/21 1446  ?BP: (!) 144/79  ?Pulse: 78  ?Resp: 18  ?Temp: 97.7 ?F (36.5 ?C)  ?SpO2: 100%  ? ?Filed Weights  ? 06/27/21 1446  ?Weight: 161 lb 4 oz (73.1 kg)  ? ? ?GENERAL:alert, no distress and comfortable ?Postradiation changes noted in the right breast with hyperpigmentation, no concern for infection ? ?LABORATORY DATA:  ?I have reviewed the data as listed ?Lab Results  ?Component Value Date  ? WBC 9.8 03/26/2021  ? HGB 14.5 03/26/2021  ? HCT 46.0 03/26/2021  ? MCV 80.3 03/26/2021  ? PLT 251 03/26/2021  ? ?Lab Results  ?Component Value Date  ? NA 141 03/26/2021  ? K 3.3 (L) 03/26/2021  ? CL 102 03/26/2021  ? CO2 32 03/26/2021  ? ? ?RADIOGRAPHIC STUDIES: ? ?I have personally reviewed the radiological reports and agreed with the findings in the report. ? ?ASSESSMENT AND PLAN ?This is a 56 year old postmenopausal female patient with newly diagnosed right breast DCIS on screening mammogram status post right breast partial mastectomy referred to medical oncology and radiation oncology for adjuvant recommendations. ?She completed adjuvant radiation on June 15, 2021, tolerated it relatively well.  She is here to start on antiestrogen therapy. ?We have  discussed mechanism of action of anti estrogen therapy, discussed adverse effects of antiestrogen therapy including postmenopausal symptoms, arthralgias or myalgias, bone loss. ?Will order a baseline bone density to screen for osteopenia/osteoporosis ?She will return to clinic in 3 months for toxicity check.  Medication has been dispensed to the pharmacy of her choice ?All her questions were answered to the best of my knowledge.  Thank you for consulting [place] the care of this patient.  Please do not hesitate to contact us with any additional questions or concerns. ? ?I have spent a total of 30 minutes minutes of face-to-face and non-face-to-face time, preparing to see the patient, performing a medically appropriate examination, counseling and educating the patient, referring and communicating with other health care professionals, documenting clinical information in the electronic  health record, and care coordination.  We have discussed the above-mentioned details. ? ? ? Benay Pike, MD ?06/27/21 ?  ?

## 2021-06-28 ENCOUNTER — Inpatient Hospital Stay: Payer: 59 | Admitting: Hematology and Oncology

## 2021-07-16 ENCOUNTER — Ambulatory Visit
Admission: RE | Admit: 2021-07-16 | Discharge: 2021-07-16 | Disposition: A | Discharge status: Home / Self Care | Payer: 59 | Source: Ambulatory Visit | Attending: Hematology and Oncology | Admitting: Hematology and Oncology

## 2021-07-16 DIAGNOSIS — D0511 Intraductal carcinoma in situ of right breast: Secondary | ICD-10-CM

## 2021-07-23 ENCOUNTER — Encounter: Payer: Self-pay | Admitting: Radiation Oncology

## 2021-07-23 ENCOUNTER — Other Ambulatory Visit: Payer: Self-pay | Admitting: Radiation Oncology

## 2021-07-23 NOTE — Progress Notes (Signed)
?  Radiation Oncology         [phone] ?________________________________ ? ?Name: Alexandra Archer MRN: [id number]  ?Date of Service: 07/16/2021  DOB: [birth year] ? ?Post Treatment Telephone Note ? ?Diagnosis:   High Grade, ER positive DCIS of the right breast ? ?Intent: Curative ? ?Radiation Treatment Dates: 05/21/2021 through 06/15/2021 ?Site Technique Total Dose (Gy) Dose per Fx (Gy) Completed Fx Beam Energies  ?Breast, Right: Breast_R 3D 42.56/42.56 2.66 16/16 6X, 10X  ?Breast, Right: Breast_R_Bst 3D 8/8 2 4/4 6X, 10X  ? ?Narrative: The patient tolerated radiation therapy relatively well. She developed anticipated skin changes in the treatment field. She is still dealing with a seroma in the right breast and wearing a sports bra to try and compress this. She follows up with Dr. Barry Dienes as well for this. ? ? ?Impression/Plan: ?1. High Grade, ER positive DCIS of the right breast. The patient has been doing well since completion of radiotherapy. We discussed that we would be happy to continue to follow her as needed, but she will also continue to follow up with Dr. Chryl Heck in medical oncology. She was counseled on skin care as well as measures to avoid sun exposure to this area.  ?2. Survivorship. We discussed the importance of survivorship evaluation and encouraged her to attend her upcoming visit with that clinic. ? ? ? ? ? ?Carola Rhine, PAC  ? ? ? ? ?

## 2021-08-03 ENCOUNTER — Telehealth: Payer: Self-pay | Admitting: Hematology and Oncology

## 2021-08-03 NOTE — Telephone Encounter (Signed)
.  Called patient to schedule appointment per 4/21 inbasket, patient is aware of date and time.   ?

## 2021-08-07 ENCOUNTER — Telehealth: Payer: Self-pay | Admitting: Hematology and Oncology

## 2021-08-07 NOTE — Telephone Encounter (Signed)
Rescheduled appointment per providers. Patient aware.  ? ?

## 2021-09-03 ENCOUNTER — Encounter: Payer: Self-pay | Admitting: Hematology and Oncology

## 2021-09-03 ENCOUNTER — Other Ambulatory Visit: Payer: Self-pay | Admitting: *Deleted

## 2021-09-03 MED ORDER — ANASTROZOLE 1 MG PO TABS: 1.0000 mg | ORAL_TABLET | Freq: Every day | ORAL | 2 refills | Status: DC

## 2021-09-20 ENCOUNTER — Inpatient Hospital Stay: Payer: 59 | Attending: Hematology and Oncology | Admitting: Hematology and Oncology

## 2021-09-20 ENCOUNTER — Other Ambulatory Visit: Payer: Self-pay

## 2021-09-20 ENCOUNTER — Inpatient Hospital Stay: Payer: 59

## 2021-09-20 ENCOUNTER — Encounter: Payer: Self-pay | Admitting: Hematology and Oncology

## 2021-09-20 VITALS — BP 149/89 | HR 93 | Temp 97.3°F | Resp 18 | Ht 67.0 in | Wt 159.6 lb

## 2021-09-20 DIAGNOSIS — M2550 Pain in unspecified joint: Secondary | ICD-10-CM | POA: Diagnosis not present

## 2021-09-20 DIAGNOSIS — Z79899 Other long term (current) drug therapy: Secondary | ICD-10-CM | POA: Diagnosis not present

## 2021-09-20 DIAGNOSIS — K219 Gastro-esophageal reflux disease without esophagitis: Secondary | ICD-10-CM | POA: Insufficient documentation

## 2021-09-20 DIAGNOSIS — I10 Essential (primary) hypertension: Secondary | ICD-10-CM | POA: Diagnosis not present

## 2021-09-20 DIAGNOSIS — E042 Nontoxic multinodular goiter: Secondary | ICD-10-CM | POA: Diagnosis not present

## 2021-09-20 DIAGNOSIS — E785 Hyperlipidemia, unspecified: Secondary | ICD-10-CM | POA: Insufficient documentation

## 2021-09-20 DIAGNOSIS — Z923 Personal history of irradiation: Secondary | ICD-10-CM | POA: Insufficient documentation

## 2021-09-20 DIAGNOSIS — D0511 Intraductal carcinoma in situ of right breast: Secondary | ICD-10-CM

## 2021-09-20 DIAGNOSIS — Z170 Estrogen receptor positive status [ER+]: Secondary | ICD-10-CM | POA: Diagnosis not present

## 2021-09-20 DIAGNOSIS — Z79811 Long term (current) use of aromatase inhibitors: Secondary | ICD-10-CM | POA: Diagnosis not present

## 2021-09-20 DIAGNOSIS — D509 Iron deficiency anemia, unspecified: Secondary | ICD-10-CM

## 2021-09-20 LAB — CBC WITH DIFFERENTIAL/PLATELET
Abs Immature Granulocytes: 0 10*3/uL (ref 0.00–0.07)
Basophils Absolute: 0 10*3/uL (ref 0.0–0.1)
Basophils Relative: 0 %
Eosinophils Absolute: 0 10*3/uL (ref 0.0–0.5)
Eosinophils Relative: 1 %
HCT: 43 % (ref 36.0–46.0)
Hemoglobin: 13.6 g/dL (ref 12.0–15.0)
Immature Granulocytes: 0 %
Lymphocytes Relative: 32 %
Lymphs Abs: 1 10*3/uL (ref 0.7–4.0)
MCH: 24.2 pg — ABNORMAL LOW (ref 26.0–34.0)
MCHC: 31.6 g/dL (ref 30.0–36.0)
MCV: 76.6 fL — ABNORMAL LOW (ref 80.0–100.0)
Monocytes Absolute: 0.4 10*3/uL (ref 0.1–1.0)
Monocytes Relative: 11 %
Neutro Abs: 1.7 10*3/uL (ref 1.7–7.7)
Neutrophils Relative %: 56 %
Platelets: 203 10*3/uL (ref 150–400)
RBC: 5.61 MIL/uL — ABNORMAL HIGH (ref 3.87–5.11)
RDW: 14.5 % (ref 11.5–15.5)
WBC: 3.1 10*3/uL — ABNORMAL LOW (ref 4.0–10.5)
nRBC: 0 % (ref 0.0–0.2)

## 2021-09-20 LAB — COMPREHENSIVE METABOLIC PANEL
ALT: 13 U/L (ref 0–44)
AST: 14 U/L — ABNORMAL LOW (ref 15–41)
Albumin: 4.6 g/dL (ref 3.5–5.0)
Alkaline Phosphatase: 102 U/L (ref 38–126)
Anion gap: 5 (ref 5–15)
BUN: 13 mg/dL (ref 6–20)
CO2: 35 mmol/L — ABNORMAL HIGH (ref 22–32)
Calcium: 10.4 mg/dL — ABNORMAL HIGH (ref 8.9–10.3)
Chloride: 100 mmol/L (ref 98–111)
Creatinine, Ser: 1.11 mg/dL — ABNORMAL HIGH (ref 0.44–1.00)
GFR, Estimated: 59 mL/min — ABNORMAL LOW (ref >60)
Glucose, Bld: 84 mg/dL (ref 70–99)
Potassium: 3.2 mmol/L — ABNORMAL LOW (ref 3.5–5.1)
Sodium: 140 mmol/L (ref 135–145)
Total Bilirubin: 0.4 mg/dL (ref 0.3–1.2)
Total Protein: 8.1 g/dL (ref 6.5–8.1)

## 2021-09-20 LAB — IRON AND IRON BINDING CAPACITY (CC-WL,HP ONLY)
Iron: 42 ug/dL (ref 28–170)
Saturation Ratios: 13 % (ref 10.4–31.8)
TIBC: 316 ug/dL (ref 250–450)
UIBC: 274 ug/dL (ref 148–442)

## 2021-09-20 LAB — VITAMIN B12: Vitamin B-12: 924 pg/mL — ABNORMAL HIGH (ref 180–914)

## 2021-09-20 NOTE — Progress Notes (Signed)
[facility] NOTE  Patient Care Team: [place], [name], [place] as PCP - General (Internal Medicine) [name], RN as Oncology Nurse Navigator [name], RN as Oncology Nurse Navigator  CHIEF COMPLAINTS/PURPOSE OF CONSULTATION:  Newly diagnosed DCIS  HISTORY OF PRESENTING ILLNESS:  Alexandra Archer 56 y.o. female is here because of recent diagnosis of right breast DCIS  I reviewed her records extensively and collaborated the history with the patient.  SUMMARY OF ONCOLOGIC HISTORY: Oncology History Overview Note  Nov 2022: She was found to have screen detected right breast calcifications.Diagnostic imaging confirmed this. There was a 1.4 cm group of coarse calcifications in the UOQ. There was a benign cyst at 1 o'clock 7 mm in greatest dimension. 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  She had a right breast lumpectomy by Dr. Barry Dienes on 03/29/2021 and pathology from this showed ductal carcinoma in situ, high-grade, 1.3 cm, margins uninvolved by carcinoma, prognostic showed ER positive, 95%, moderate, PR negative.   Ductal carcinoma in situ (DCIS) of right breast  03/14/2021 Initial Diagnosis   Ductal carcinoma in situ (DCIS) of right breast    2019,right breast biopsy, benign. 2004, right breast fibroadenoma removed. She completed adjuvant radiation on 06/15/2021 She tolerated it well except for some skin blistering which is now healing.  She is here for follow-up on antiestrogen therapy  She is tolerating anastrazole ok. She says its causing lot of arthralgias and vaginal dryness, but she was very reluctant to switch to tamoxifen. Mood is labile, she says, she she is happy and cries when she is sad. She was wondering if we can check her B12 and iron since she needed iron infusions in the past.  She was also hoping we can follow-up on her thyroid nodules.  She had an ultrasound back in 2021 which showed some thyroid  nodules.  She otherwise has enlarged right breast seroma which was recently drained.  She is due for mammogram in October  MEDICAL HISTORY:  Past Medical History:  Diagnosis Date   Anemia    Anxiety    Asthma    Breast cancer ([place]) 02/23/2021   GERD (gastroesophageal reflux disease)    Glaucoma    Goiter    Hyperlipidemia    Primary hypertension    Seasonal allergic rhinitis     SURGICAL HISTORY: Past Surgical History:  Procedure Laterality Date   BREAST LUMPECTOMY WITH RADIOACTIVE SEED LOCALIZATION Right 03/29/2021   Procedure: RIGHT BREAST LUMPECTOMY WITH RADIOACTIVE SEED LOCALIZATION;  Surgeon: Stark Klein, MD;  Location: [facility];  Service: General;  Laterality: Right;  65 MINUTES ROOM 2   [place]  2012   MENISCUS REPAIR Right 2001    SOCIAL HISTORY: Social History   Socioeconomic History   Marital status: Married    Spouse name: Not on file   Number of children: Not on file   Years of education: Not on file   Highest education level: Not on file  Occupational History   Not on file  Tobacco Use   Smoking status: Never    Passive exposure: Never   Smokeless tobacco: Never  Substance and Sexual Activity   Alcohol use: Yes    Comment: Social   Drug use: Not on file   Sexual activity: Yes  Other Topics Concern   Not on file  Social History Narrative   Lives at home with husband.  Social Determinants of Health   Financial Resource Strain: Not on file  Food Insecurity: Not on file  Transportation Needs: Not on file  Physical Activity: Not on file  Stress: Not on file  Social Connections: Not on file  Intimate Partner Violence: Not on file    FAMILY HISTORY: Family History  Problem Relation Age of Onset   Esophageal cancer Mother    Stomach cancer Father    Heart attack Father 30   Heart attack Brother 81   Prostate cancer Paternal Grandfather     ALLERGIES:  has No Known Allergies.  MEDICATIONS:   Current Outpatient Medications  Medication Sig Dispense Refill   albuterol (VENTOLIN HFA) 108 (90 Base) MCG/ACT inhaler Inhale 1-2 puffs into the lungs every 4 (four) hours as needed for wheezing or shortness of breath.     amLODipine (NORVASC) 5 MG tablet Take 5 mg by mouth daily.     anastrozole (ARIMIDEX) 1 MG tablet Take 1 tablet (1 mg total) by mouth daily. 30 tablet 2   Ascorbic Acid (VITAMIN C) 100 MG tablet Take 100 mg by mouth daily.     Cholecalciferol (VITAMIN D3) 50 MCG (2000 UT) CAPS Take 2,000 Units by mouth daily.     Cyanocobalamin (B-12) 100 MCG TABS Take 100 mcg by mouth daily.     fluticasone (FLONASE) 50 MCG/ACT nasal spray Place 1 spray into both nostrils daily. (Patient not taking: Reported on 04/05/2021) 16 g 2   fluticasone-salmeterol (ADVAIR DISKUS) 250-50 MCG/ACT AEPB Inhale 1 puff into the lungs in the morning and at bedtime. 60 each 5   ipratropium (ATROVENT) 0.06 % nasal spray Place 2 sprays into both nostrils 4 (four) times daily. 15 mL 1   latanoprost (XALATAN) 0.005 % ophthalmic solution Place 1 drop into both eyes at bedtime.     omeprazole (PRILOSEC) 40 MG capsule Take 40 mg by mouth daily.     traZODone (DESYREL) 50 MG tablet Take 50 mg by mouth at bedtime as needed for sleep.     No current facility-administered medications for this visit.    PHYSICAL EXAMINATION: ECOG PERFORMANCE STATUS: 0 - Asymptomatic  Vitals:   09/20/21 1528  BP: (!) 149/89  Pulse: 93  Resp: 18  Temp: (!) 97.3 F (36.3 C)  SpO2: 100%   Filed Weights   09/20/21 1528  Weight: 159 lb 9.6 oz (72.4 kg)    GENERAL:alert, no distress and comfortable Breast: Right breast with large postop seroma pretty much occupying the upper outer quadrant.  No palpable masses or regional adenopathy in bilateral breast.  LABORATORY DATA:  I have reviewed the data as listed Lab Results  Component Value Date   WBC 3.1 (L) 09/20/2021   HGB 13.6 09/20/2021   HCT 43.0 09/20/2021   MCV 76.6  (L) 09/20/2021   PLT 203 09/20/2021   Lab Results  Component Value Date   NA 141 03/26/2021   K 3.3 (L) 03/26/2021   CL 102 03/26/2021   CO2 32 03/26/2021    RADIOGRAPHIC STUDIES:  I have personally reviewed the radiological reports and agreed with the findings in the report.  ASSESSMENT AND PLAN.  Ductal carcinoma in situ (DCIS) of right breast This is a 56 year old postmenopausal female patient with newly diagnosed right breast DCIS on screening mammogram status post right breast partial mastectomy referred to medical oncology and radiation oncology for adjuvant recommendations. She completed adjuvant radiation on June 15, 2021, tolerated it relatively well.  She is here for follow-up  on anastrozole.  Review of systems today pertinent for labile mood, hot flashes, vaginal dryness, arthralgias.  Physical examination noted the right breast hematoma/postop seroma with no other concerning changes.  We discussed about considering tamoxifen but she is absolutely reluctant about this because of the small risk of DVT/PE with tamoxifen.  We have encouraged trying to stay active, physical therapy for arthralgias.  She can consider coconut oil suppositories for vaginal dryness.  We have discussed about considering low-dose estrogen via vaginal route if the vaginal dryness is very bothersome but she is once again reluctant to consider this. She wants to continue anastrozole and she will return to clinic in about 6 months. I have also ordered ultrasound thyroid per patient's request and I have given her the phone number to call and schedule this. CBC, iron panel and B12 ordered today.  I have spent a total of 30 minutes minutes of face-to-face and non-face-to-face time, preparing to see the patient, performing a medically appropriate examination, counseling and educating the patient, referring and communicating with other health care professionals, documenting clinical information in the electronic  health record, and care coordination.  We have discussed the above-mentioned details.    Benay Pike, MD 09/20/21

## 2021-09-20 NOTE — Assessment & Plan Note (Signed)
This is a 56 year old postmenopausal female patient with newly diagnosed right breast DCIS on screening mammogram status post right breast partial mastectomy referred to medical oncology and radiation oncology for adjuvant recommendations. She completed adjuvant radiation on June 15, 2021, tolerated it relatively well.  She is here for follow-up on anastrozole.  Review of systems today pertinent for labile mood, hot flashes, vaginal dryness, arthralgias.  Physical examination noted the right breast hematoma/postop seroma with no other concerning changes.  We discussed about considering tamoxifen but she is absolutely reluctant about this because of the small risk of DVT/PE with tamoxifen.  We have encouraged trying to stay active, physical therapy for arthralgias.  She can consider coconut oil suppositories for vaginal dryness.  We have discussed about considering low-dose estrogen via vaginal route if the vaginal dryness is very bothersome but she is once again reluctant to consider this. She wants to continue anastrozole and she will return to clinic in about 6 months. I have also ordered ultrasound thyroid per patient's request and I have given her the phone number to call and schedule this. CBC, iron panel and B12 ordered today.

## 2021-09-21 LAB — FERRITIN: Ferritin: 149 ng/mL (ref 11–307)

## 2021-10-04 ENCOUNTER — Ambulatory Visit: Payer: 59 | Admitting: Hematology and Oncology

## 2021-10-04 ENCOUNTER — Encounter: Payer: Self-pay | Admitting: Hematology and Oncology

## 2021-10-04 ENCOUNTER — Other Ambulatory Visit: Payer: Self-pay | Admitting: *Deleted

## 2021-10-04 DIAGNOSIS — D0511 Intraductal carcinoma in situ of right breast: Secondary | ICD-10-CM

## 2021-10-09 ENCOUNTER — Encounter: Payer: 59 | Admitting: Adult Health

## 2021-10-09 ENCOUNTER — Encounter: Payer: Self-pay | Admitting: Hematology and Oncology

## 2021-10-09 NOTE — Therapy (Signed)
OUTPATIENT PHYSICAL THERAPY ONCOLOGY EVALUATION  Patient Name: Alexandra Archer MRN: [id number] DOB:21-Dec-1965, 56 y.o., female Today's Date: 10/10/2021   PT End of Session - 10/10/21 0846     Visit Number 1    Number of Visits 7    Date for PT Re-Evaluation 12/05/21    PT Start Time 0800    PT Stop Time 0840    PT Time Calculation (min) 40 min    Activity Tolerance Patient tolerated treatment well    Behavior During Therapy [facility] for tasks assessed/performed             Past Medical History:  Diagnosis Date   Anemia    Anxiety    Asthma    Breast cancer ([place]) 02/23/2021   GERD (gastroesophageal reflux disease)    Glaucoma    Goiter    Hyperlipidemia    Primary hypertension    Seasonal allergic rhinitis    Past Surgical History:  Procedure Laterality Date   BREAST LUMPECTOMY WITH RADIOACTIVE SEED LOCALIZATION Right 03/29/2021   Procedure: RIGHT BREAST LUMPECTOMY WITH RADIOACTIVE SEED LOCALIZATION;  Surgeon: Stark Klein, MD;  Location: [facility];  Service: General;  Laterality: Right;  [address] [place]  2012   MENISCUS REPAIR Right 2001   Patient Active Problem List   Diagnosis Date Noted   Severe persistent asthma with acute exacerbation 04/13/2021   Ductal carcinoma in situ (DCIS) of right breast 03/14/2021    PCP: Thayer Ohm, PA  REFERRING PROVIDER: Dr. Benay Pike  REFERRING DIAG: Rt breast cancer  THERAPY DIAG:  Ductal carcinoma in situ (DCIS) of right breast  Aftercare following surgery for neoplasm  Stiffness of right shoulder, not elsewhere classified  ONSET DATE: 03/29/21  Rationale for Evaluation and Treatment Rehabilitation  SUBJECTIVE                                                                                                                                                                                           SUBJECTIVE STATEMENT: After radiation I started to have some  stiffness in the shoulder. The Rt breast is also swollen.  They had to drain in x 1.    PERTINENT HISTORY:  Rt breast lumpectomy by Dr. Barry Dienes on 03/29/21 due to DCIS and no lymph nodes removed.  Completed radiation on 06/15/21.  Noted to have large postop seroma "pretty  much occupying the upper outer quadrant". Hx of Rt frozen shoulder.    PAIN:  Are you having pain? No It only hurts in the shoulder when I use my arm overhead  PRECAUTIONS: Cancer hx  WEIGHT BEARING RESTRICTIONS No  FALLS:  Has patient fallen in last 6 months? 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 +1 tenderness to palpation biceps supraspinatus tendon region  OBSERVATIONS / OTHER ASSESSMENTS: well healed incision, darkened skin from radiation field  POSTURE: WNL  UPPER EXTREMITY AROM/PROM:  A/PROM RIGHT   eval   Shoulder extension 50  Shoulder flexion 140 - top of shoulder pain  Shoulder abduction 147 - top of shoulder pain  Shoulder internal rotation   Shoulder external rotation 95    (Blank rows = not tested)  A/PROM LEFT   eval  Shoulder extension 62  Shoulder flexion 160  Shoulder abduction 170  Shoulder internal rotation   Shoulder external rotation 105    (Blank rows = not tested)   UPPER EXTREMITY STRENGTH:  Pain with resisted flexion, abduction, ER not able to test due to initial pain  QUICK DASH SURVEY: 59%  TODAY'S TREATMENT  10/10/21 Gave pt script for compression bra from second to nature and instruction on use of this Gave pt initial HEP per below with  performance of each Education on impingement and treatment   PATIENT EDUCATION:  Education details: per today's session notes Person educated: Patient Education method: Consulting civil engineer, Media planner, Verbal cues, and Handouts Education comprehension: verbalized understanding, returned demonstration, and verbal cues required   HOME EXERCISE PROGRAM: Access Code: [email or web address] URL: [email or web address] Date: 10/10/2021 Prepared by: Shan Levans  Exercises - Standing Row with Anchored Resistance - 1 x daily - 7 x weekly - 1-3 sets - 10 reps - 2-3 second hold - Shoulder extension with resistance - Neutral - 1 x daily - 7 x weekly - 1-3 sets - 10 reps - 2-3 second hold - Doorway Pec Stretch at 90 Degrees Abduction - 1 x daily - 7 x weekly - 1-3 sets - 3 reps - 20-30 seconds hold  ASSESSMENT: CLINICAL IMPRESSION: Patient is a 56 y.o. female who was seen today for physical therapy evaluation and treatment for her continued Rt shoulder pain and stiffness post lumpectomy without SLNB but with large seroma complication. They are watching to see how the seroma heals but pt does not have a compression bra so she will obtain one of these to start with.  She has impingement type pain on the anterior shoulder with overhead reach and tenderness to palpation biceps supraspinatus tendon region.     OBJECTIVE IMPAIRMENTS decreased ROM, decreased strength, and pain.   ACTIVITY LIMITATIONS carrying, lifting, and reach over head  PARTICIPATION LIMITATIONS: none  PERSONAL FACTORS 1 comorbidity: radiation hx  are also affecting patient's functional outcome.   REHAB POTENTIAL: Excellent  CLINICAL DECISION MAKING: Stable/uncomplicated  EVALUATION COMPLEXITY: Low  GOALS: Goals reviewed with patient? Yes  SHORT TERM GOALS: Target date: 10/24/2021    Pt will be ind with initial HEP  Baseline: Goal status: INITIAL  2.  Pt will obtain compression bra with appropriate foam as needed Baseline:   Goal status: INITIAL   LONG TERM GOALS: Target date: 11/07/2021    Pt will improve AROM of  the Rt shoulder to at least 160deg of flexion and abduction to improve reach Baseline:  Goal status: INITIAL  2.  Pt will report ability to sleep is improved by at least 75% due to less pain in the shoulder and breast Baseline:  Goal status: INITIAL  3.  Pt will decrease QDASH to <19% Baseline:  Goal status: INITIAL  4.  Pt will be ind with final HEP Baseline:  Goal status: INITIAL   PLAN: PT FREQUENCY: 1-2x/week  PT DURATION: 4 weeks  PLANNED INTERVENTIONS: Therapeutic exercises, Therapeutic activity, Neuromuscular re-education, Patient/Family education, Joint mobilization, DME instructions, Dry Needling, Taping, Ionotophoresis '4mg'$ /ml Dexamethasone, Manual therapy, and Re-evaluation  PLAN FOR NEXT SESSION: ionto if signed back, get bra? Add foam? PROM and release of Rt upper quadrant PRN, improve impingement symptoms with TE   Stark Bray, PT 10/10/2021, 8:47 AM

## 2021-10-10 ENCOUNTER — Ambulatory Visit: Payer: 59 | Attending: Hematology and Oncology | Admitting: Rehabilitation

## 2021-10-10 ENCOUNTER — Other Ambulatory Visit: Payer: Self-pay

## 2021-10-10 ENCOUNTER — Encounter: Payer: Self-pay | Admitting: Rehabilitation

## 2021-10-10 DIAGNOSIS — Z483 Aftercare following surgery for neoplasm: Secondary | ICD-10-CM | POA: Diagnosis present

## 2021-10-10 DIAGNOSIS — M25611 Stiffness of right shoulder, not elsewhere classified: Secondary | ICD-10-CM | POA: Diagnosis present

## 2021-10-10 DIAGNOSIS — D0511 Intraductal carcinoma in situ of right breast: Secondary | ICD-10-CM | POA: Diagnosis present

## 2021-10-12 ENCOUNTER — Ambulatory Visit: Payer: 59 | Admitting: Rehabilitation

## 2021-10-12 ENCOUNTER — Encounter: Payer: Self-pay | Admitting: Rehabilitation

## 2021-10-12 DIAGNOSIS — D0511 Intraductal carcinoma in situ of right breast: Secondary | ICD-10-CM

## 2021-10-12 DIAGNOSIS — Z483 Aftercare following surgery for neoplasm: Secondary | ICD-10-CM

## 2021-10-12 DIAGNOSIS — M25611 Stiffness of right shoulder, not elsewhere classified: Secondary | ICD-10-CM

## 2021-10-12 NOTE — Therapy (Signed)
OUTPATIENT PHYSICAL THERAPY ONCOLOGY EVALUATION  Patient Name: Alexandra Archer MRN: [id number] DOB:[birth year], 56 y.o., female Today's Date: 10/12/2021   PT End of Session - 10/12/21 0751     Visit Number 2    Number of Visits 7    Date for PT Re-Evaluation 12/05/21    PT Start Time 0800    PT Stop Time 0846    PT Time Calculation (min) 46 min    Activity Tolerance Patient tolerated treatment well    Behavior During Therapy [facility] for tasks assessed/performed             Past Medical History:  Diagnosis Date   Anemia    Anxiety    Asthma    Breast cancer ([place]) 02/23/2021   GERD (gastroesophageal reflux disease)    Glaucoma    Goiter    Hyperlipidemia    Primary hypertension    Seasonal allergic rhinitis    Past Surgical History:  Procedure Laterality Date   BREAST LUMPECTOMY WITH RADIOACTIVE SEED LOCALIZATION Right 03/29/2021   Procedure: RIGHT BREAST LUMPECTOMY WITH RADIOACTIVE SEED LOCALIZATION;  Surgeon: Stark Klein, MD;  Location: [facility];  Service: General;  Laterality: Right;  56 Alexandra Archer  2012   MENISCUS REPAIR Right 2001   Patient Active Problem List   Diagnosis Date Noted   Severe persistent asthma with acute exacerbation 04/13/2021   Ductal carcinoma in situ (DCIS) of right breast 03/14/2021    PCP: Thayer Ohm, PA  REFERRING PROVIDER: Dr. Benay Pike  REFERRING DIAG: Rt breast cancer  THERAPY DIAG:  Ductal carcinoma in situ (DCIS) of right breast  Aftercare following surgery for neoplasm  Stiffness of right shoulder, not elsewhere classified  ONSET DATE: 03/29/21  Rationale for Evaluation and Treatment Rehabilitation  SUBJECTIVE                                                                                                                                                                                           SUBJECTIVE STATEMENT: Nothing new  PERTINENT HISTORY:  Rt  breast lumpectomy by Dr. Barry Dienes on 03/29/21 due to DCIS and no lymph nodes removed.  Completed radiation on 06/15/21.  Noted to have large postop seroma "pretty  much occupying the upper outer quadrant". Hx of Rt frozen shoulder.    PAIN:  Are you having pain? No It only hurts in the shoulder when I use my arm overhead   PRECAUTIONS: Cancer hx  WEIGHT BEARING RESTRICTIONS No  FALLS:  Has patient fallen in last 6 months? 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 +1 tenderness to palpation biceps supraspinatus tendon region  UPPER EXTREMITY AROM/PROM: A/PROM RIGHT   eval   Shoulder extension 50  Shoulder flexion 140 - top of shoulder pain  Shoulder abduction 147 - top of shoulder pain  Shoulder internal rotation   Shoulder external rotation 95    (Blank rows = not tested)  A/PROM LEFT   eval  Shoulder extension 62  Shoulder flexion 160  Shoulder abduction 170  Shoulder internal rotation   Shoulder external rotation 105    (Blank rows = not tested)   UPPER EXTREMITY STRENGTH:  Pain with resisted flexion, abduction, ER not able to test due to initial pain  QUICK DASH SURVEY: 59%  TODAY'S TREATMENT  10/12/21 Therapeutic exercise: Pulleys flexion and abduction x98mn each with initial cueing and avoiding any pinch Yellow band row and extension x 15 bil Attempted bil ER but with pain - switched to ER walkouts x 10 with yellow Yellow band horizontal abduction x 10  Manual Therapy: STM pectoralis and axillary borders in arm overhead position and in sidelying to latissimus and rhomboids Iontophoresis: with dexamethasone to the Rt anterior  shoulder near subacromial space with instruction for wear and how and when to remove.  10/10/21 Gave pt script for compression bra from [employer] nature and instruction on use of this Gave pt initial HEP per below with performance of each Education on impingement and treatment   PATIENT EDUCATION:  Education details: per today's session notes Person educated: Patient Education method: EConsulting civil engineer Demonstration, Verbal cues, and Handouts Education comprehension: verbalized understanding, returned demonstration, and verbal cues required   HOME EXERCISE PROGRAM: Access Code: [email or web address] URL: [email or web address] Date: 10/10/2021 Prepared by: KShan Levans Exercises - Standing Row with Anchored Resistance - 1 x daily - 7 x weekly - 1-3 sets - 10 reps - 2-3 second hold - Shoulder extension with resistance - Neutral - 1 x daily - 7 x weekly - 1-3 sets - 10 reps - 2-3 second hold - Doorway Pec Stretch at 90 Degrees Abduction - 1 x daily - 7 x weekly - 1-3 sets - 3 reps - 20-30 seconds hold  ASSESSMENT: CLINICAL IMPRESSION: Pt was able to tolerate all except resisted Er without pinching/pain.  Started ionto patch #1 to decrease tenderness at the anterior shoulder.  Will cont POC.   OBJECTIVE IMPAIRMENTS decreased ROM, decreased strength, and pain.   ACTIVITY LIMITATIONS carrying, lifting, and reach over head  PARTICIPATION LIMITATIONS: none  PERSONAL FACTORS 1 comorbidity: radiation hx  are also affecting patient's functional outcome.   REHAB POTENTIAL: Excellent  CLINICAL DECISION MAKING: Stable/uncomplicated  EVALUATION COMPLEXITY: Low  GOALS: Goals reviewed with patient? Yes  SHORT TERM GOALS: Target date: 10/24/2021    Pt will be ind with initial HEP  Baseline: Goal status: INITIAL  2.  Pt will obtain compression bra with appropriate foam as needed Baseline:  Goal status: INITIAL   LONG TERM GOALS: Target date: 11/07/2021    Pt will improve AROM of the  Rt shoulder to at least 160deg of flexion and abduction to improve reach Baseline:  Goal status: INITIAL  2.  Pt will report  ability to sleep is improved by at least 75% due to less pain in the shoulder and breast Baseline:  Goal status: INITIAL  3.  Pt will decrease QDASH to <19% Baseline:  Goal status: INITIAL  4.  Pt will be ind with final HEP Baseline:  Goal status: INITIAL   PLAN: PT FREQUENCY: 1-2x/week  PT DURATION: 4 weeks  PLANNED INTERVENTIONS: Therapeutic exercises, Therapeutic activity, Neuromuscular re-education, Patient/Family education, Joint mobilization, DME instructions, Dry Needling, Taping, Ionotophoresis '4mg'$ /ml Dexamethasone, Manual therapy, and Re-evaluation  PLAN FOR NEXT SESSION: ionto #2 if ok, get bra? Add foam? PROM and release of Rt upper quadrant PRN, improve impingement symptoms with Casimer Bilis, PT 10/12/2021, 8:47 AM

## 2021-10-18 ENCOUNTER — Ambulatory Visit (HOSPITAL_COMMUNITY)
Admission: RE | Admit: 2021-10-18 | Discharge: 2021-10-18 | Disposition: A | Discharge status: Home / Self Care | Payer: 59 | Source: Ambulatory Visit | Attending: Hematology and Oncology | Admitting: Hematology and Oncology

## 2021-10-18 DIAGNOSIS — E042 Nontoxic multinodular goiter: Secondary | ICD-10-CM | POA: Insufficient documentation

## 2021-10-18 DIAGNOSIS — D0511 Intraductal carcinoma in situ of right breast: Secondary | ICD-10-CM | POA: Diagnosis present

## 2021-10-24 ENCOUNTER — Telehealth: Payer: Self-pay | Admitting: *Deleted

## 2021-10-24 ENCOUNTER — Encounter: Payer: Self-pay | Admitting: Hematology and Oncology

## 2021-10-24 NOTE — Telephone Encounter (Signed)
"  Alexandra Archer, my date of birth is 11-23-1965, phone number [phone] (home) , calling about FMLA paperwork."  Connected with patient.  Reports previously faxed FMLA paperwork on October 09, 2021.  Need intermittent leave to cover appointments.  Begin with next Lymphedema appointment 10/25/2021.  Oncology referred me to this clinic."       Advised no receipt of form.  Requested fax number to resend, unable to share fax number used.  Provided main fax number [phone].  E-mailed [facility] Authorization for Use/Disclosure and [place] to [email or web address].  Awaiting receipt of FMLA paperwork and authorization.

## 2021-10-25 ENCOUNTER — Ambulatory Visit: Payer: 59 | Admitting: Rehabilitation

## 2021-10-29 NOTE — Therapy (Signed)
OUTPATIENT PHYSICAL THERAPY ONCOLOGY TREATMENT  Patient Name: Alexandra Archer MRN: [id number] DOB:11-Oct-1965, 56 y.o., female Today's Date: 10/30/2021   PT End of Session - 10/30/21 0756     Visit Number 3    Number of Visits 7    Date for PT Re-Evaluation 12/05/21    PT Start Time 0800    PT Stop Time 0845    PT Time Calculation (min) 45 min    Activity Tolerance Patient tolerated treatment well    Behavior During Therapy [facility] for tasks assessed/performed              Past Medical History:  Diagnosis Date   Anemia    Anxiety    Asthma    Breast cancer ([place]) 02/23/2021   GERD (gastroesophageal reflux disease)    Glaucoma    Goiter    Hyperlipidemia    Primary hypertension    Seasonal allergic rhinitis    Past Surgical History:  Procedure Laterality Date   BREAST LUMPECTOMY WITH RADIOACTIVE SEED LOCALIZATION Right 03/29/2021   Procedure: RIGHT BREAST LUMPECTOMY WITH RADIOACTIVE SEED LOCALIZATION;  Surgeon: Stark Klein, MD;  Location: [place];  Service: General;  Laterality: Right;  4 [place]  2012   MENISCUS REPAIR Right 2001   Patient Active Problem List   Diagnosis Date Noted   Severe persistent asthma with acute exacerbation 04/13/2021   Ductal carcinoma in situ (DCIS) of right breast 03/14/2021    PCP: Thayer Ohm, PA  REFERRING PROVIDER: Dr. Benay Pike  REFERRING DIAG: Rt breast cancer  THERAPY DIAG:  Ductal carcinoma in situ (DCIS) of right breast  Aftercare following surgery for neoplasm  Stiffness of right shoulder, not elsewhere classified  ONSET DATE: 03/29/21  Rationale for Evaluation and Treatment Rehabilitation  SUBJECTIVE                                                                                                                                                                                           SUBJECTIVE STATEMENT: Feels about the same - the patch was  fine  PERTINENT HISTORY:  Rt breast lumpectomy by Dr. Barry Dienes on 03/29/21 due to DCIS and no lymph nodes removed.  Completed radiation on 06/15/21.  Noted to have large postop seroma "pretty  much occupying the upper outer quadrant". Hx of Rt frozen shoulder.    PAIN:  Are you having pain? No It only hurts in the shoulder when I use my arm overhead   PRECAUTIONS: Cancer hx  WEIGHT BEARING RESTRICTIONS No  FALLS:  Has patient fallen in last 6  months? 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 +1 tenderness to palpation biceps supraspinatus tendon region  UPPER EXTREMITY AROM/PROM: A/PROM RIGHT   eval   Shoulder extension 50  Shoulder flexion 140 - top of shoulder pain  Shoulder abduction 147 - top of shoulder pain  Shoulder internal rotation   Shoulder external rotation 95    (Blank rows = not tested)  A/PROM LEFT   eval  Shoulder extension 62  Shoulder flexion 160  Shoulder abduction 170  Shoulder internal rotation   Shoulder external rotation 105    (Blank rows = not tested)   UPPER EXTREMITY STRENGTH:  Pain with resisted flexion, abduction, ER not able to test due to initial pain  QUICK DASH SURVEY: 59%  TODAY'S TREATMENT  10/30/21 Therapeutic exercise: Pulleys flexion and abduction x51mn each with initial cueing and avoiding any pinch red band row and extension x 15 bil Attempted bil ER but with pain - switched to ER walkouts x 10 cable 3# Yellow band horizontal abduction x 10  Manual Therapy: STM pectoralis and axillary borders in arm overhead position and in sidelying to latissimus and rhomboids PROM Rt shoulder GH AP and  inferior glides at various angles of abduction Iontophoresis: with dexamethasone to the Rt anterior shoulder near subacromial space with instruction for wear and how and when to remove  10/12/21 Therapeutic exercise: Pulleys flexion and abduction x273m each with initial cueing and avoiding any pinch Yellow band row and extension x 15 bil Attempted bil ER but with pain - switched to ER walkouts x 10 with yellow Yellow band horizontal abduction x 10  Manual Therapy: STM pectoralis and axillary borders in arm overhead position and in sidelying to latissimus and rhomboids Iontophoresis: with dexamethasone to the Rt anterior shoulder near subacromial space with instruction for wear and how and when to remove.  10/10/21 Gave pt script for compression bra from [employer] and instruction on use of this Gave pt initial HEP per below with performance of each Education on impingement and treatment   PATIENT EDUCATION:  Education details: per today's session notes Person educated: Patient Education method: ExConsulting civil engineerDemonstration, Verbal cues, and Handouts Education comprehension: verbalized understanding, returned demonstration, and verbal cues required   HOME EXERCISE PROGRAM: Access Code: [email or web address] URL: [email or web address] Date: 10/10/2021 Prepared by: KaShan LevansExercises - Standing Row with Anchored Resistance - 1 x daily - 7 x weekly - 1-3 sets - 10 reps - 2-3 second hold - Shoulder extension with resistance - Neutral - 1 x daily - 7 x weekly - 1-3 sets - 10 reps - 2-3 second hold - Doorway Pec Stretch at 90 Degrees Abduction - 1 x daily - 7 x weekly - 1-3 sets - 3 reps - 20-30 seconds hold  ASSESSMENT: CLINICAL IMPRESSION: Pt continues with impingement like pain with overhead position.  Ionto patch #2/6 applied today.  Continued with pain with external rotation resisted.  Will add some more isometrics or supine TE next time   OBJECTIVE IMPAIRMENTS decreased  ROM, decreased strength, and pain.   ACTIVITY LIMITATIONS carrying, lifting, and reach over head  PARTICIPATION LIMITATIONS: none  PERSONAL  FACTORS 1 comorbidity: radiation hx  are also affecting patient's functional outcome.   REHAB POTENTIAL: Excellent  CLINICAL DECISION MAKING: Stable/uncomplicated  EVALUATION COMPLEXITY: Low  GOALS: Goals reviewed with patient? Yes  SHORT TERM GOALS: Target date: 10/24/2021    Pt will be ind with initial HEP  Baseline: Goal status: INITIAL  2.  Pt will obtain compression bra with appropriate foam as needed Baseline:  Goal status: INITIAL   LONG TERM GOALS: Target date: 11/07/2021    Pt will improve AROM of the Rt shoulder to at least 160deg of flexion and abduction to improve reach Baseline:  Goal status: INITIAL  2.  Pt will report ability to sleep is improved by at least 75% due to less pain in the shoulder and breast Baseline:  Goal status: INITIAL  3.  Pt will decrease QDASH to <19% Baseline:  Goal status: INITIAL  4.  Pt will be ind with final HEP Baseline:  Goal status: INITIAL   PLAN: PT FREQUENCY: 1-2x/week  PT DURATION: 4 weeks  PLANNED INTERVENTIONS: Therapeutic exercises, Therapeutic activity, Neuromuscular re-education, Patient/Family education, Joint mobilization, DME instructions, Dry Needling, Taping, Ionotophoresis '4mg'$ /ml Dexamethasone, Manual therapy, and Re-evaluation  PLAN FOR NEXT SESSION: ionto #3 - PROM and release of Rt upper quadrant PRN, improve impingement symptoms with TE   Stark Bray, PT 10/30/2021, 8:45 AM

## 2021-10-30 ENCOUNTER — Encounter: Payer: Self-pay | Admitting: Rehabilitation

## 2021-10-30 ENCOUNTER — Ambulatory Visit: Payer: 59 | Attending: Hematology and Oncology | Admitting: Rehabilitation

## 2021-10-30 DIAGNOSIS — M25611 Stiffness of right shoulder, not elsewhere classified: Secondary | ICD-10-CM | POA: Insufficient documentation

## 2021-10-30 DIAGNOSIS — D0511 Intraductal carcinoma in situ of right breast: Secondary | ICD-10-CM | POA: Diagnosis not present

## 2021-10-30 DIAGNOSIS — Z483 Aftercare following surgery for neoplasm: Secondary | ICD-10-CM | POA: Diagnosis present

## 2021-11-01 ENCOUNTER — Ambulatory Visit: Payer: 59 | Admitting: Rehabilitative and Restorative Service Providers"

## 2021-11-01 ENCOUNTER — Encounter: Payer: Self-pay | Admitting: Rehabilitative and Restorative Service Providers"

## 2021-11-01 DIAGNOSIS — D0511 Intraductal carcinoma in situ of right breast: Secondary | ICD-10-CM | POA: Diagnosis not present

## 2021-11-01 DIAGNOSIS — Z483 Aftercare following surgery for neoplasm: Secondary | ICD-10-CM

## 2021-11-01 DIAGNOSIS — M25611 Stiffness of right shoulder, not elsewhere classified: Secondary | ICD-10-CM

## 2021-11-01 NOTE — Therapy (Signed)
OUTPATIENT PHYSICAL THERAPY ONCOLOGY TREATMENT  Patient Name: Alexandra Archer MRN: [id number] DOB:1965-08-02, 56 y.o., female Today's Date: 11/01/2021   PT End of Session - 11/01/21 0735     Visit Number 4    Date for PT Re-Evaluation 12/05/21    PT Start Time 0730    PT Stop Time 0800    PT Time Calculation (min) 30 min    Activity Tolerance Patient tolerated treatment well    Behavior During Therapy [facility] for tasks assessed/performed              Past Medical History:  Diagnosis Date   Anemia    Anxiety    Asthma    Breast cancer ([place]) 02/23/2021   GERD (gastroesophageal reflux disease)    Glaucoma    Goiter    Hyperlipidemia    Primary hypertension    Seasonal allergic rhinitis    Past Surgical History:  Procedure Laterality Date   BREAST LUMPECTOMY WITH RADIOACTIVE SEED LOCALIZATION Right 03/29/2021   Procedure: RIGHT BREAST LUMPECTOMY WITH RADIOACTIVE SEED LOCALIZATION;  Surgeon: Stark Klein, MD;  Location: [place];  Service: General;  Laterality: Right;  [address] [place]  2012   MENISCUS REPAIR Right 2001   Patient Active Problem List   Diagnosis Date Noted   Severe persistent asthma with acute exacerbation 04/13/2021   Ductal carcinoma in situ (DCIS) of right breast 03/14/2021    PCP: Thayer Ohm, PA  REFERRING PROVIDER: Dr. Benay Pike  REFERRING DIAG: Rt breast cancer  THERAPY DIAG:  Ductal carcinoma in situ (DCIS) of right breast  Aftercare following surgery for neoplasm  Stiffness of right shoulder, not elsewhere classified  ONSET DATE: 03/29/21  Rationale for Evaluation and Treatment Rehabilitation  SUBJECTIVE                                                                                                                                                                                           SUBJECTIVE STATEMENT: Pt reports no new complaints.  Reports that she is unsure if  the patch is helping or not.  PERTINENT HISTORY:  Rt breast lumpectomy by Dr. Barry Dienes on 03/29/21 due to DCIS and no lymph nodes removed.  Completed radiation on 06/15/21.  Noted to have large postop seroma "pretty  much occupying the upper outer quadrant". Hx of Rt frozen shoulder.    PAIN:  Are you having pain? Yes Reports R shoulder pain of 6/10, throbbing pain. It only hurts in the shoulder when I use my arm overhead   PRECAUTIONS: Cancer hx  WEIGHT BEARING RESTRICTIONS  No  FALLS:  Has patient fallen in last 6 months? 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 +1 tenderness to palpation biceps supraspinatus tendon region  UPPER EXTREMITY AROM/PROM: A/PROM RIGHT   eval   Shoulder extension 50  Shoulder flexion 140 - top of shoulder pain  Shoulder abduction 147 - top of shoulder pain  Shoulder internal rotation   Shoulder external rotation 95    (Blank rows = not tested)  A/PROM LEFT   eval  Shoulder extension 62  Shoulder flexion 160  Shoulder abduction 170  Shoulder internal rotation   Shoulder external rotation 105    (Blank rows = not tested)   UPPER EXTREMITY STRENGTH:  Pain with resisted flexion, abduction, ER not able to test due to initial pain  QUICK DASH SURVEY: 59%  TODAY'S TREATMENT: 11/01/2021: Pulleys for flexion and abduction x2 min each Standing rows and shoulder extension with red tband 2x10 bilat Bilateral shoulder ER and horizontal abduction with red tband x10 reps Supine shoulder flexion with cane with 2# 2x10 Supine chest press with cane with 2# 2x10 PROM in supine to right shoulder in all planes of  motion Iontophoresis: Iontopatch with dexamethasone to the Rt anterior shoulder near subacromial space with instruction for wear and how and when to remove   10/30/21 Therapeutic exercise: Pulleys flexion and abduction x13mn each with initial cueing and avoiding any pinch red band row and extension x 15 bil Attempted bil ER but with pain - switched to ER walkouts x 10 cable 3# Yellow band horizontal abduction x 10  Manual Therapy: STM pectoralis and axillary borders in arm overhead position and in sidelying to latissimus and rhomboids PROM Rt shoulder GH AP and inferior glides at various angles of abduction Iontophoresis: with dexamethasone to the Rt anterior shoulder near subacromial space with instruction for wear and how and when to remove  10/12/21 Therapeutic exercise: Pulleys flexion and abduction x230m each with initial cueing and avoiding any pinch Yellow band row and extension x 15 bil Attempted bil ER but with pain - switched to ER walkouts x 10 with yellow Yellow band horizontal abduction x 10  Manual Therapy: STM pectoralis and axillary borders in arm overhead position and in sidelying to latissimus and rhomboids Iontophoresis: with dexamethasone to the Rt anterior shoulder near subacromial space with instruction for wear and how and when to remove.   PATIENT EDUCATION:  Education details: per today's session notes Person educated: Patient Education method: Explanation, Demonstration, Verbal cues, and Handouts Education comprehension: verbalized understanding, returned demonstration, and verbal cues required   HOME EXERCISE PROGRAM: Access Code: [email or web address] URL: [email or web address] Date: 10/10/2021 Prepared by: KaShan LevansExercises - Standing Row with Anchored Resistance - 1 x daily - 7 x weekly - 1-3 sets - 10 reps - 2-3 second hold - Shoulder extension with resistance - Neutral - 1 x daily - 7 x weekly - 1-3 sets - 10 reps - 2-3 second hold -  Doorway Pec Stretch at 90 Degrees Abduction - 1 x daily - 7 x weekly - 1-3 sets - 3 reps - 20-30 seconds hold  ASSESSMENT: CLINICAL IMPRESSION: Ms PeOlthoffresents  to PT with some complaints of pain secondary to appointment being in the early morning.  She states that she has been wearing her undergarment compression bra but has not noted a difference yet.  Pt reporting that her shoulder felt better following P/ROM. Pt continues to require skilled PT to progress towards goal related activities.  OBJECTIVE IMPAIRMENTS decreased ROM, decreased strength, and pain.   ACTIVITY LIMITATIONS carrying, lifting, and reach over head  PARTICIPATION LIMITATIONS: none  PERSONAL FACTORS 1 comorbidity: radiation hx  are also affecting patient's functional outcome.   REHAB POTENTIAL: Excellent  CLINICAL DECISION MAKING: Stable/uncomplicated  EVALUATION COMPLEXITY: Low  GOALS: Goals reviewed with patient? Yes  SHORT TERM GOALS: Target date: 10/24/2021    Pt will be ind with initial HEP  Baseline: Goal status: MET on 11/01/21  2.  Pt will obtain compression bra with appropriate foam as needed Baseline:  Goal status: MET on 11/01/21   LONG TERM GOALS: Target date: 11/07/2021    Pt will improve AROM of the Rt shoulder to at least 160deg of flexion and abduction to improve reach Baseline:  Goal status: INITIAL  2.  Pt will report ability to sleep is improved by at least 75% due to less pain in the shoulder and breast Baseline:  Goal status: INITIAL  3.  Pt will decrease QDASH to <19% Baseline:  Goal status: INITIAL  4.  Pt will be ind with final HEP Baseline:  Goal status: INITIAL   PLAN: PT FREQUENCY: 1-2x/week  PT DURATION: 4 weeks  PLANNED INTERVENTIONS: Therapeutic exercises, Therapeutic activity, Neuromuscular re-education, Patient/Family education, Joint mobilization, DME instructions, Dry Needling, Taping, Ionotophoresis 27m/ml Dexamethasone, Manual therapy, and  Re-evaluation  PLAN FOR NEXT SESSION: ionto #4 - PROM and release of Rt upper quadrant PRN, improve impingement symptoms with TE   SJuel Burrow PT 11/01/2021, 8:30 AM  [facility] [place] [address]Phone # [phone] [phone]

## 2021-11-09 ENCOUNTER — Ambulatory Visit: Payer: 59 | Admitting: Rehabilitation

## 2021-11-09 ENCOUNTER — Encounter: Payer: Self-pay | Admitting: Rehabilitation

## 2021-11-09 DIAGNOSIS — D0511 Intraductal carcinoma in situ of right breast: Secondary | ICD-10-CM | POA: Diagnosis not present

## 2021-11-09 DIAGNOSIS — Z483 Aftercare following surgery for neoplasm: Secondary | ICD-10-CM

## 2021-11-09 DIAGNOSIS — M25611 Stiffness of right shoulder, not elsewhere classified: Secondary | ICD-10-CM

## 2021-11-09 NOTE — Therapy (Signed)
OUTPATIENT PHYSICAL THERAPY ONCOLOGY TREATMENT  Patient Name: Alexandra Archer MRN: [id number] DOB:[birth year], 56 y.o., female Today's Date: 11/09/2021   PT End of Session - 11/09/21 0801     Visit Number 5    Number of Visits 7    Date for PT Re-Evaluation 12/05/21    PT Start Time 0802    PT Stop Time 0845    PT Time Calculation (min) 43 min    Activity Tolerance Patient tolerated treatment well    Behavior During Therapy [facility] for tasks assessed/performed               Past Medical History:  Diagnosis Date   Anemia    Anxiety    Asthma    Breast cancer ([place]) 02/23/2021   GERD (gastroesophageal reflux disease)    Glaucoma    Goiter    Hyperlipidemia    Primary hypertension    Seasonal allergic rhinitis    Past Surgical History:  Procedure Laterality Date   BREAST LUMPECTOMY WITH RADIOACTIVE SEED LOCALIZATION Right 03/29/2021   Procedure: RIGHT BREAST LUMPECTOMY WITH RADIOACTIVE SEED LOCALIZATION;  Surgeon: Stark Klein, MD;  Location: [place];  Service: General;  Laterality: Right;  [address] [place]  2012   MENISCUS REPAIR Right 2001   Patient Active Problem List   Diagnosis Date Noted   Severe persistent asthma with acute exacerbation 04/13/2021   Ductal carcinoma in situ (DCIS) of right breast 03/14/2021    PCP: Thayer Ohm, PA  REFERRING PROVIDER: Dr. Benay Pike  REFERRING DIAG: Rt breast cancer  THERAPY DIAG:  Ductal carcinoma in situ (DCIS) of right breast  Aftercare following surgery for neoplasm  Stiffness of right shoulder, not elsewhere classified  ONSET DATE: 03/29/21  Rationale for Evaluation and Treatment Rehabilitation  SUBJECTIVE                                                                                                                                                                                           SUBJECTIVE STATEMENT: It is the worst at night.  I was  able to sleep on my stomach for the first time in awhile.    PERTINENT HISTORY:  Rt breast lumpectomy by Dr. Barry Dienes on 03/29/21 due to DCIS and no lymph nodes removed.  Completed radiation on 06/15/21.  Noted to have large postop seroma "pretty  much occupying the upper outer quadrant". Hx of Rt frozen shoulder.    PAIN:  Are you having pain? Yes Reports R shoulder pain of 6/10, throbbing pain. It only hurts in the shoulder when I  use my arm overhead   PRECAUTIONS: Cancer hx  WEIGHT BEARING RESTRICTIONS No  FALLS:  Has patient fallen in last 6 months? 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 +1 tenderness to palpation biceps supraspinatus tendon region  UPPER EXTREMITY AROM/PROM: A/PROM RIGHT   eval  11/09/21  Shoulder extension 50   Shoulder flexion 140 - top of shoulder pain 160  Shoulder abduction 147 - top of shoulder pain 156 - pain  Shoulder internal rotation    Shoulder external rotation 95     (Blank rows = not tested)  A/PROM LEFT   eval  Shoulder extension 62  Shoulder flexion 160  Shoulder abduction 170  Shoulder internal rotation   Shoulder external rotation 105    (Blank rows = not tested)   UPPER EXTREMITY STRENGTH:  Pain with resisted flexion, abduction, ER not able to test due to initial pain  QUICK DASH SURVEY: 59%  TODAY'S TREATMENT: 11/09/2021: Pulleys for flexion and abduction x2 min each Standing rows and shoulder extension with red tband 2x10 bilat Bilateral shoulder ER and horizontal abduction with yellow tband x10 reps without pinch  Supine shoulder flexion with cane with 2# 2x10 Supine chest press with  cane with 2# 2x10 Sidelying ER 2# x 10 2#  Sidelying ABD 2# x 10  PROM in supine to right shoulder in all planes of motion  11/01/2021: Pulleys for flexion and abduction x2 min each Standing rows and shoulder extension with red tband 2x10 bilat Bilateral shoulder ER and horizontal abduction with red tband x10 reps Supine shoulder flexion with cane with 2# 2x10 Supine chest press with cane with 2# 2x10 PROM in supine to right shoulder in all planes of motion Iontophoresis: Iontopatch with dexamethasone to the Rt anterior shoulder near subacromial space with instruction for wear and how and when to remove  10/30/21 Therapeutic exercise: Pulleys flexion and abduction x76mn each with initial cueing and avoiding any pinch red band row and extension x 15 bil Attempted bil ER but with pain - switched to ER walkouts x 10 cable 3# Yellow band horizontal abduction x 10  Manual Therapy: STM pectoralis and axillary borders in arm overhead position and in sidelying to latissimus and rhomboids PROM Rt shoulder GH AP and inferior glides at various angles of abduction Iontophoresis: with dexamethasone to the Rt anterior shoulder near subacromial space with instruction for wear and how and when to remove  PATIENT EDUCATION:  Education details: per today's session notes Person educated: Patient Education method: EConsulting civil engineer Demonstration, Verbal cues, and Handouts Education comprehension: verbalized understanding, returned demonstration, and verbal cues required   HOME EXERCISE PROGRAM: Access Code: [email or web address] URL: [email or web address] Date: 11/09/2021 Prepared by: KShan Levans Exercises - Standing Row with Anchored Resistance  - 1 x daily - 3 x weekly - 1-3 sets - 10 reps - 2-3 second hold - Shoulder extension with resistance - Neutral  - 1 x daily - 3 x weekly - 1-3 sets - 10 reps - 2-3 second hold - Doorway Pec Stretch at 90 Degrees Abduction  - 1 x daily - 7 x weekly - 1-3 sets  - 3 reps -  20-30 seconds hold - Standing Shoulder External Rotation with Resistance  - 1 x daily - 3 x weekly - 1-3 sets - 10 reps - 2-3 seconds hold  ASSESSMENT: CLINICAL IMPRESSION: Pt is demonstrating improvements in AROM with less pinching.  Still pinching into end range abduction.  Stopped ionto as it did not seem to make much difference.  Updated HEP  OBJECTIVE IMPAIRMENTS decreased ROM, decreased strength, and pain.   ACTIVITY LIMITATIONS carrying, lifting, and reach over head  PARTICIPATION LIMITATIONS: none  PERSONAL FACTORS 1 comorbidity: radiation hx  are also affecting patient's functional outcome.   REHAB POTENTIAL: Excellent  CLINICAL DECISION MAKING: Stable/uncomplicated  EVALUATION COMPLEXITY: Low  GOALS: Goals reviewed with patient? Yes  SHORT TERM GOALS: Target date: 10/24/2021    Pt will be ind with initial HEP  Baseline: Goal status: MET on 11/01/21  2.  Pt will obtain compression bra with appropriate foam as needed Baseline:  Goal status: MET on 11/01/21   LONG TERM GOALS: Target date: 11/07/2021    Pt will improve AROM of the Rt shoulder to at least 160deg of flexion and abduction to improve reach Baseline:  Goal status: INITIAL  2.  Pt will report ability to sleep is improved by at least 75% due to less pain in the shoulder and breast Baseline:  Goal status: INITIAL  3.  Pt will decrease QDASH to <19% Baseline:  Goal status: INITIAL  4.  Pt will be ind with final HEP Baseline:  Goal status: INITIAL   PLAN: PT FREQUENCY: 1-2x/week  PT DURATION: 4 weeks  PLANNED INTERVENTIONS: Therapeutic exercises, Therapeutic activity, Neuromuscular re-education, Patient/Family education, Joint mobilization, DME instructions, Dry Needling, Taping, Ionotophoresis 26m/ml Dexamethasone, Manual therapy, and Re-evaluation  PLAN FOR NEXT SESSION: PROM and release of Rt upper quadrant PRN, improve impingement symptoms with TE   SJuel Burrow PT 11/09/2021,  8:45 AM  [facility] [place] [address]Phone # [phone] [phone]

## 2021-11-13 ENCOUNTER — Ambulatory Visit: Payer: 59 | Attending: Hematology and Oncology | Admitting: Rehabilitative and Restorative Service Providers"

## 2021-11-13 ENCOUNTER — Encounter: Payer: Self-pay | Admitting: Rehabilitative and Restorative Service Providers"

## 2021-11-13 DIAGNOSIS — Z483 Aftercare following surgery for neoplasm: Secondary | ICD-10-CM | POA: Diagnosis present

## 2021-11-13 DIAGNOSIS — D0511 Intraductal carcinoma in situ of right breast: Secondary | ICD-10-CM | POA: Diagnosis present

## 2021-11-13 DIAGNOSIS — M25611 Stiffness of right shoulder, not elsewhere classified: Secondary | ICD-10-CM | POA: Diagnosis present

## 2021-11-13 NOTE — Therapy (Signed)
OUTPATIENT PHYSICAL THERAPY ONCOLOGY TREATMENT  Patient Name: Alexandra Archer MRN: [id number] DOB:[birth year], 56 y.o., female Today's Date: 11/13/2021   PT End of Session - 11/13/21 0733     Visit Number 6    Date for PT Re-Evaluation 12/05/21    PT Start Time 0730    PT Stop Time 0800    PT Time Calculation (min) 30 min    Activity Tolerance Patient tolerated treatment well    Behavior During Therapy [facility] for tasks assessed/performed               Past Medical History:  Diagnosis Date   Anemia    Anxiety    Asthma    Breast cancer ([place]) 02/23/2021   GERD (gastroesophageal reflux disease)    Glaucoma    Goiter    Hyperlipidemia    Primary hypertension    Seasonal allergic rhinitis    Past Surgical History:  Procedure Laterality Date   BREAST LUMPECTOMY WITH RADIOACTIVE SEED LOCALIZATION Right 03/29/2021   Procedure: RIGHT BREAST LUMPECTOMY WITH RADIOACTIVE SEED LOCALIZATION;  Surgeon: Stark Klein, MD;  Location: [place];  Service: General;  Laterality: Right;  [address] [place]  2012   MENISCUS REPAIR Right 2001   Patient Active Problem List   Diagnosis Date Noted   Severe persistent asthma with acute exacerbation 04/13/2021   Ductal carcinoma in situ (DCIS) of right breast 03/14/2021    PCP: Thayer Ohm, PA  REFERRING PROVIDER: Dr. Benay Pike  REFERRING DIAG: Rt breast cancer  THERAPY DIAG:  Ductal carcinoma in situ (DCIS) of right breast  Aftercare following surgery for neoplasm  Stiffness of right shoulder, not elsewhere classified  ONSET DATE: 03/29/21  Rationale for Evaluation and Treatment Rehabilitation  SUBJECTIVE                                                                                                                                                                                           SUBJECTIVE STATEMENT: Pt reports that she is still able to sleep on her stomach  for some time at night, but pain is worse at night.  Currently denies pain  PERTINENT HISTORY:  Rt breast lumpectomy by Dr. Barry Dienes on 03/29/21 due to DCIS and no lymph nodes removed.  Completed radiation on 06/15/21.  Noted to have large postop seroma "pretty  much occupying the upper outer quadrant". Hx of Rt frozen shoulder.    PAIN:  Are you having pain? Yes Reports R shoulder pain of 6/10 at night, throbbing pain. It only hurts in the shoulder when I use  my arm overhead   PRECAUTIONS: Cancer hx  WEIGHT BEARING RESTRICTIONS No  FALLS:  Has patient fallen in last 6 months? 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 +1 tenderness to palpation biceps supraspinatus tendon region  UPPER EXTREMITY AROM/PROM: A/PROM RIGHT   eval  11/09/21  Shoulder extension 50   Shoulder flexion 140 - top of shoulder pain 160  Shoulder abduction 147 - top of shoulder pain 156 - pain  Shoulder internal rotation    Shoulder external rotation 95     (Blank rows = not tested)  A/PROM LEFT   eval  Shoulder extension 62  Shoulder flexion 160  Shoulder abduction 170  Shoulder internal rotation   Shoulder external rotation 105    (Blank rows = not tested)   UPPER EXTREMITY STRENGTH:  Pain with resisted flexion, abduction, ER not able to test due to initial pain  QUICK DASH SURVEY: 59%  TODAY'S TREATMENT: 11/13/2021: Pulleys for flexion and abduction x2 min each Standing rows and shoulder extension with red tband 2x10 bilat Supine shoulder flexion with cane with 2# 2x10 Supine chest press with cane with 2# 2x10 Sidelying ER 2# x 10 2#  Sidelying ABD 2# x 10   PROM in supine to right shoulder in all planes of motion  11/09/2021: Pulleys for flexion and abduction x2 min each Standing rows and shoulder extension with red tband 2x10 bilat Bilateral shoulder ER and horizontal abduction with yellow tband x10 reps without pinch  Supine shoulder flexion with cane with 2# 2x10 Supine chest press with cane with 2# 2x10 Sidelying ER 2# x 10 2#  Sidelying ABD 2# x 10  PROM in supine to right shoulder in all planes of motion  11/01/2021: Pulleys for flexion and abduction x2 min each Standing rows and shoulder extension with red tband 2x10 bilat Bilateral shoulder ER and horizontal abduction with red tband x10 reps Supine shoulder flexion with cane with 2# 2x10 Supine chest press with cane with 2# 2x10 PROM in supine to right shoulder in all planes of motion Iontophoresis: Iontopatch with dexamethasone to the Rt anterior shoulder near subacromial space with instruction for wear and how and when to remove    PATIENT EDUCATION:  Education details: per today's session notes Person educated: Patient Education method: Consulting civil engineer, Media planner, Verbal cues, and Handouts Education comprehension: verbalized understanding, returned demonstration, and verbal cues required   HOME EXERCISE PROGRAM: Access Code: [email or web address] URL: [email or web address] Date: 11/09/2021 Prepared by: Shan Levans  Exercises - Standing Row with Anchored Resistance  - 1 x daily - 3 x weekly - 1-3 sets - 10 reps - 2-3 second hold - Shoulder extension with resistance - Neutral  - 1 x daily - 3 x weekly - 1-3 sets - 10 reps - 2-3 second hold - Doorway Pec Stretch at 90 Degrees Abduction  - 1 x daily - 7 x weekly - 1-3 sets - 3 reps - 20-30 seconds hold - Standing Shoulder External Rotation with Resistance  - 1 x daily - 3 x weekly - 1-3 sets - 10 reps - 2-3 seconds hold  ASSESSMENT: CLINICAL IMPRESSION: Reese presents to skilled rehabilitation stating  that she is  having no pain, but does feel a tightness/pulling.  Pt reports that at night, she continues to have increased pain.  Pt continues with most difficulty with sidelying ER and abduction, but is able to complete.  Following PROM, pt reports feeling better and looser.  Pt continues to require skilled PT to progress towards goal related activities.  OBJECTIVE IMPAIRMENTS decreased ROM, decreased strength, and pain.   ACTIVITY LIMITATIONS carrying, lifting, and reach over head  PARTICIPATION LIMITATIONS: none  PERSONAL FACTORS 1 comorbidity: radiation hx  are also affecting patient's functional outcome.   REHAB POTENTIAL: Excellent  CLINICAL DECISION MAKING: Stable/uncomplicated  EVALUATION COMPLEXITY: Low  GOALS: Goals reviewed with patient? Yes  SHORT TERM GOALS: Target date: 10/24/2021    Pt will be ind with initial HEP  Baseline: Goal status: MET on 11/01/21  2.  Pt will obtain compression bra with appropriate foam as needed Baseline:  Goal status: MET on 11/01/21   LONG TERM GOALS: Target date: 11/07/2021    Pt will improve AROM of the Rt shoulder to at least 160deg of flexion and abduction to improve reach Baseline:  Goal status: IN PROGRESS (see above)  2.  Pt will report ability to sleep is improved by at least 75% due to less pain in the shoulder and breast Baseline:  Goal status: IN PROGRESS  3.  Pt will decrease QDASH to <19% Baseline:  Goal status: INITIAL  4.  Pt will be ind with final HEP Baseline:  Goal status: INITIAL   PLAN: PT FREQUENCY: 1-2x/week  PT DURATION: 4 weeks  PLANNED INTERVENTIONS: Therapeutic exercises, Therapeutic activity, Neuromuscular re-education, Patient/Family education, Joint mobilization, DME instructions, Dry Needling, Taping, Ionotophoresis 20m/ml Dexamethasone, Manual therapy, and Re-evaluation  PLAN FOR NEXT SESSION: PROM and release of Rt upper quadrant PRN, improve impingement symptoms with TE   SJuel Burrow PT 11/13/2021,  8:23 AM  [facility] SSevernGAztec [place] [address]Phone # [phone] [phone]

## 2021-11-20 ENCOUNTER — Telehealth: Payer: Self-pay

## 2021-11-20 ENCOUNTER — Ambulatory Visit: Payer: 59

## 2021-11-20 DIAGNOSIS — D0511 Intraductal carcinoma in situ of right breast: Secondary | ICD-10-CM

## 2021-11-20 DIAGNOSIS — Z483 Aftercare following surgery for neoplasm: Secondary | ICD-10-CM

## 2021-11-20 DIAGNOSIS — M25611 Stiffness of right shoulder, not elsewhere classified: Secondary | ICD-10-CM

## 2021-11-20 NOTE — Telephone Encounter (Signed)
Notified patient of completion of FMLA paperwork. Copy emailed to Patient at e-mail provided ([email or web address]). Copy emailed to Patient's Employer at e-mail provided ([email or web address]) No other needs or concerns voiced at this time.

## 2021-11-20 NOTE — Therapy (Signed)
OUTPATIENT PHYSICAL THERAPY ONCOLOGY TREATMENT  Patient Name: Alexandra Archer MRN: [id number] DOB:[birth year], 56 y.o., female Today's Date: 11/20/2021   PT End of Session - 11/20/21 0807     Visit Number 7    Number of Visits 7    Date for PT Re-Evaluation 12/05/21    PT Start Time 0805    PT Stop Time 0901    PT Time Calculation (min) 56 min    Activity Tolerance Patient tolerated treatment well    Behavior During Therapy [facility] for tasks assessed/performed               Past Medical History:  Diagnosis Date   Anemia    Anxiety    Asthma    Breast cancer ([place]) 02/23/2021   GERD (gastroesophageal reflux disease)    Glaucoma    Goiter    Hyperlipidemia    Primary hypertension    Seasonal allergic rhinitis    Past Surgical History:  Procedure Laterality Date   BREAST LUMPECTOMY WITH RADIOACTIVE SEED LOCALIZATION Right 03/29/2021   Procedure: RIGHT BREAST LUMPECTOMY WITH RADIOACTIVE SEED LOCALIZATION;  Surgeon: Stark Klein, MD;  Location: [place];  Service: General;  Laterality: Right;  [address] [place]  2012   MENISCUS REPAIR Right 2001   Patient Active Problem List   Diagnosis Date Noted   Severe persistent asthma with acute exacerbation 04/13/2021   Ductal carcinoma in situ (DCIS) of right breast 03/14/2021    PCP: Thayer Ohm, PA  REFERRING PROVIDER: Dr. Benay Pike  REFERRING DIAG: Rt breast cancer  THERAPY DIAG:  Ductal carcinoma in situ (DCIS) of right breast  Aftercare following surgery for neoplasm  Stiffness of right shoulder, not elsewhere classified  ONSET DATE: 03/29/21  Rationale for Evaluation and Treatment Rehabilitation  SUBJECTIVE                                                                                                                                                                                           SUBJECTIVE STATEMENT: My Rt shoulder just feels tight this  morning, nothing out of the normal.   PERTINENT HISTORY:  Rt breast lumpectomy by Dr. Barry Dienes on 03/29/21 due to DCIS and no lymph nodes removed.  Completed radiation on 06/15/21.  Noted to have large postop seroma "pretty  much occupying the upper outer quadrant". Hx of Rt frozen shoulder.    PAIN:  Are you having pain? No  PRECAUTIONS: Cancer hx  WEIGHT BEARING RESTRICTIONS No  FALLS:  Has patient fallen in last 6 months? 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 +1 tenderness to palpation biceps supraspinatus tendon region  UPPER EXTREMITY AROM/PROM: A/PROM RIGHT   eval  11/09/21  Shoulder extension 50   Shoulder flexion 140 - top of shoulder pain 160  Shoulder abduction 147 - top of shoulder pain 156 - pain  Shoulder internal rotation    Shoulder external rotation 95     (Blank rows = not tested)  A/PROM LEFT   eval  Shoulder extension 62  Shoulder flexion 160  Shoulder abduction 170  Shoulder internal rotation   Shoulder external rotation 105    (Blank rows = not tested)   UPPER EXTREMITY STRENGTH:  Pain with resisted flexion, abduction, ER not able to test due to initial pain  QUICK DASH SURVEY: 59%  TODAY'S TREATMENT:  11/20/2021 Therapeutic Exercises Pulleys for flexion and abduction x2 min each Ball roll up wall into flexion x10 but started having pain so stopped returning therapist demo and tactile cues to decrease Rt scapular compensation Standing rows and shoulder extension with red tband 2x10 bil Supine shoulder flexion with cane with 3# 2x10 Supine chest press with cane with 3# 2x10 Sidelying ER 2# x 4, then 1# 2 x 10  , pt reports arm feeling very weak with this today so decr weight Sidelying ABD 2#, 2 x 10    Manual Therapy PROM in supine to right shoulder in all planes of motion with scapular depression Lt S/L for Rt scapular mobs into protraction and retraction   11/13/2021: Pulleys for flexion and abduction x2 min each Standing rows and shoulder extension with red tband 2x10 bilat Supine shoulder flexion with cane with 2# 2x10 Supine chest press with cane with 2# 2x10 Sidelying ER 2# x 10 2#  Sidelying ABD 2# x 10  PROM in supine to right shoulder in all planes of motion  11/09/2021: Pulleys for flexion and abduction x2 min each Standing rows and shoulder extension with red tband 2x10 bilat Bilateral shoulder ER and horizontal abduction with yellow tband x10 reps without pinch  Supine shoulder flexion with cane with 2# 2x10 Supine chest press with cane with 2# 2x10 Sidelying ER 2# x 10 2#  Sidelying ABD 2# x 10  PROM in supine to right shoulder in all planes of motion     PATIENT EDUCATION:  Education details: per today's session notes Person educated: Patient Education method: Consulting civil engineer, Demonstration, Verbal cues, and Handouts Education comprehension: verbalized understanding, returned demonstration, and verbal cues required   HOME EXERCISE PROGRAM: Access Code: [email or web address] URL: [email or web address] Date: 11/09/2021 Prepared by: Shan Levans  Exercises - Standing Row with Anchored Resistance  - 1 x daily - 3 x weekly - 1-3 sets - 10 reps - 2-3 second hold - Shoulder extension with resistance - Neutral  - 1 x daily - 3 x weekly - 1-3 sets - 10 reps - 2-3 second hold - Doorway Pec Stretch at 90 Degrees Abduction  - 1 x daily - 7 x weekly - 1-3 sets - 3 reps - 20-30 seconds hold - Standing Shoulder External Rotation with Resistance  - 1 x daily - 3 x weekly - 1-3 sets - 10 reps -  2-3 seconds hold  ASSESSMENT: CLINICAL IMPRESSION: Pts tightness is still limiting her end  motions with some pain if pushed far enough. She is doing well with tolerating Rt UE strength exercises without increased pain. We did have to decrease to 1# with Lt S/L for Rt er as she reported her UE felt weaker with this today but we were able to increase reps. She was also able to add rep of S/L abd, and increased the weights for supine with dowel activities. Overall pt reports noticing the Rt shoulder tightness has improved since starting physical therapy as she is able to reach better.   OBJECTIVE IMPAIRMENTS decreased ROM, decreased strength, and pain.   ACTIVITY LIMITATIONS carrying, lifting, and reach over head  PARTICIPATION LIMITATIONS: none  PERSONAL FACTORS 1 comorbidity: radiation hx  are also affecting patient's functional outcome.   REHAB POTENTIAL: Excellent  CLINICAL DECISION MAKING: Stable/uncomplicated  EVALUATION COMPLEXITY: Low  GOALS: Goals reviewed with patient? Yes  SHORT TERM GOALS: Target date: 10/24/2021    Pt will be ind with initial HEP  Baseline: Goal status: MET on 11/01/21  2.  Pt will obtain compression bra with appropriate foam as needed Baseline:  Goal status: MET on 11/01/21   LONG TERM GOALS: Target date: 11/07/2021    Pt will improve AROM of the Rt shoulder to at least 160deg of flexion and abduction to improve reach Baseline:  Goal status: IN PROGRESS (see above)  2.  Pt will report ability to sleep is improved by at least 75% due to less pain in the shoulder and breast Baseline:  Goal status: IN PROGRESS  3.  Pt will decrease QDASH to <19% Baseline:  Goal status: INITIAL  4.  Pt will be ind with final HEP Baseline:  Goal status: INITIAL   PLAN: PT FREQUENCY: 1-2x/week  PT DURATION: 4 weeks  PLANNED INTERVENTIONS: Therapeutic exercises, Therapeutic activity, Neuromuscular re-education, Patient/Family education, Joint mobilization, DME instructions, Dry Needling, Taping, Ionotophoresis 76m/ml Dexamethasone, Manual therapy, and  Re-evaluation  PLAN FOR NEXT SESSION: Renewal needed next session. PROM and release of Rt upper quadrant PRN, improve impingement symptoms with TE  VCollie Siad PTA 11/20/21 9:03 AM   BColes[address] SQui[place]nai[place] VillageGPortersville [place] [address]Phone # [phone] [phone]

## 2021-11-22 ENCOUNTER — Ambulatory Visit: Payer: 59 | Admitting: Rehabilitation

## 2021-11-22 ENCOUNTER — Encounter: Payer: Self-pay | Admitting: Rehabilitation

## 2021-11-22 DIAGNOSIS — D0511 Intraductal carcinoma in situ of right breast: Secondary | ICD-10-CM

## 2021-11-22 DIAGNOSIS — Z483 Aftercare following surgery for neoplasm: Secondary | ICD-10-CM

## 2021-11-22 DIAGNOSIS — M25611 Stiffness of right shoulder, not elsewhere classified: Secondary | ICD-10-CM

## 2021-11-22 NOTE — Therapy (Signed)
OUTPATIENT PHYSICAL THERAPY ONCOLOGY TREATMENT  Patient Name: Alexandra Archer MRN: [id number] DOB:17-Aug-1965, 56 y.o., female Today's Date: 11/22/2021   PT End of Session - 11/22/21 0847     Visit Number 8    Number of Visits 10    Date for PT Re-Evaluation 12/05/21    PT Start Time 0802    PT Stop Time 0847    PT Time Calculation (min) 45 min    Activity Tolerance Patient tolerated treatment well    Behavior During Therapy [facility] for tasks assessed/performed                Past Medical History:  Diagnosis Date   Anemia    Anxiety    Asthma    Breast cancer ([place]) 02/23/2021   GERD (gastroesophageal reflux disease)    Glaucoma    Goiter    Hyperlipidemia    Primary hypertension    Seasonal allergic rhinitis    Past Surgical History:  Procedure Laterality Date   BREAST LUMPECTOMY WITH RADIOACTIVE SEED LOCALIZATION Right 03/29/2021   Procedure: RIGHT BREAST LUMPECTOMY WITH RADIOACTIVE SEED LOCALIZATION;  Surgeon: Stark Klein, MD;  Location: [place];  Service: General;  Laterality: Right;  [address] [place]  2012   MENISCUS REPAIR Right 2001   Patient Active Problem List   Diagnosis Date Noted   Severe persistent asthma with acute exacerbation 04/13/2021   Ductal carcinoma in situ (DCIS) of right breast 03/14/2021    PCP: Thayer Ohm, PA  REFERRING PROVIDER: Dr. Benay Pike  REFERRING DIAG: Rt breast cancer  THERAPY DIAG:  Ductal carcinoma in situ (DCIS) of right breast  Aftercare following surgery for neoplasm  Stiffness of right shoulder, not elsewhere classified  ONSET DATE: 03/29/21  Rationale for Evaluation and Treatment Rehabilitation  SUBJECTIVE                                                                                                                                                                                           SUBJECTIVE STATEMENT:  I still only notice it mainly  at night  PERTINENT HISTORY:  Rt breast lumpectomy by Dr. Barry Dienes on 03/29/21 due to DCIS and no lymph nodes removed.  Completed radiation on 06/15/21.  Noted to have large postop seroma "pretty  much occupying the upper outer quadrant". Hx of Rt frozen shoulder.    PAIN:  Are you having pain? No  PRECAUTIONS: Cancer hx  WEIGHT BEARING RESTRICTIONS No  FALLS:  Has patient fallen in last 6 months? 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 +1 tenderness to palpation biceps supraspinatus tendon region  UPPER EXTREMITY AROM/PROM: A/PROM RIGHT   eval  11/09/21  Shoulder extension 50   Shoulder flexion 140 - top of shoulder pain 160  Shoulder abduction 147 - top of shoulder pain 156 - pain  Shoulder internal rotation    Shoulder external rotation 95     (Blank rows = not tested)  A/PROM LEFT   eval  Shoulder extension 62  Shoulder flexion 160  Shoulder abduction 170  Shoulder internal rotation   Shoulder external rotation 105    (Blank rows = not tested)   UPPER EXTREMITY STRENGTH:  Pain with resisted flexion, abduction, ER not able to test due to initial pain  QUICK DASH SURVEY: 59%  TODAY'S TREATMENT: 11/22/2021 Therapeutic Exercises Pulleys for flexion and abduction x2 min each Standing rows and shoulder extension with red tband 2x10 bil Supine shoulder flexion with cane with 4# 2x10 Supine chest press with cane with 4# 2x10 Sidelying ABD 2#, 2 x 10   Manual Therapy PROM in supine to right shoulder in all planes of motion with scapular depression GH inferior and AP glides in various positions grade IV  11/20/2021 Therapeutic  Exercises Pulleys for flexion and abduction x2 min each Ball roll up wall into flexion x10 but started having pain so stopped returning therapist demo and tactile cues to decrease Rt scapular compensation Standing rows and shoulder extension with red tband 2x10 bil Supine shoulder flexion with cane with 3# 2x10 Supine chest press with cane with 3# 2x10 Sidelying ER 2# x 4, then 1# 2 x 10 , pt reports arm feeling very weak with this today so decr weight Sidelying ABD 2#, 2 x 10    Manual Therapy PROM in supine to right shoulder in all planes of motion with scapular depression Lt S/L for Rt scapular mobs into protraction and retraction   11/13/2021: Pulleys for flexion and abduction x2 min each Standing rows and shoulder extension with red tband 2x10 bilat Supine shoulder flexion with cane with 2# 2x10 Supine chest press with cane with 2# 2x10 Sidelying ER 2# x 10 2#  Sidelying ABD 2# x 10  PROM in supine to right shoulder in all planes of motion  PATIENT EDUCATION:  Education details: per today's session notes Person educated: Patient Education method: Consulting civil engineer, Demonstration, Verbal cues, and Handouts Education comprehension: verbalized understanding, returned demonstration, and verbal cues required   HOME EXERCISE PROGRAM: Access Code: [email or web address] URL: [email or web address] Date: 11/09/2021 Prepared by: Shan Levans  Exercises - Standing Row with Anchored Resistance  - 1 x daily - 3 x weekly - 1-3 sets - 10 reps - 2-3 second hold - Shoulder extension with resistance - Neutral  - 1 x daily - 3 x weekly - 1-3 sets - 10 reps - 2-3 second hold - Doorway Pec Stretch at 90 Degrees Abduction  - 1 x daily - 7 x weekly - 1-3 sets - 3 reps - 20-30 seconds hold - Standing Shoulder External Rotation with Resistance  - 1 x daily - 3 x weekly - 1-3 sets - 10 reps - 2-3 seconds hold  ASSESSMENT: CLINICAL IMPRESSION: Pt is  doing well.  Still with decreased ER range with  impingement and feelings of weakness  OBJECTIVE IMPAIRMENTS decreased ROM, decreased strength, and pain.   ACTIVITY LIMITATIONS carrying, lifting, and reach over head  PARTICIPATION LIMITATIONS: none  PERSONAL FACTORS 1 comorbidity: radiation hx  are also affecting patient's functional outcome.   REHAB POTENTIAL: Excellent  CLINICAL DECISION MAKING: Stable/uncomplicated  EVALUATION COMPLEXITY: Low  GOALS: Goals reviewed with patient? Yes  SHORT TERM GOALS: Target date: 10/24/2021    Pt will be ind with initial HEP  Baseline: Goal status: MET on 11/01/21  2.  Pt will obtain compression bra with appropriate foam as needed Baseline:  Goal status: MET on 11/01/21   LONG TERM GOALS: Target date: 11/07/2021    Pt will improve AROM of the Rt shoulder to at least 160deg of flexion and abduction to improve reach Baseline:  Goal status: IN PROGRESS (see above)  2.  Pt will report ability to sleep is improved by at least 75% due to less pain in the shoulder and breast Baseline:  Goal status: IN PROGRESS  3.  Pt will decrease QDASH to <19% Baseline:  Goal status: INITIAL  4.  Pt will be ind with final HEP Baseline:  Goal status: INITIAL   PLAN: PT FREQUENCY: 1-2x/week  PT DURATION: 4 weeks  PLANNED INTERVENTIONS: Therapeutic exercises, Therapeutic activity, Neuromuscular re-education, Patient/Family education, Joint mobilization, DME instructions, Dry Needling, Taping, Ionotophoresis 47m/ml Dexamethasone, Manual therapy, and Re-evaluation  PLAN FOR NEXT SESSION: PROM and release of Rt upper quadrant PRN, improve impingement symptoms with TBraxton Feathers PT 11/22/21 8:48 AM   [facility] CampusSpecialty Rehab Services [address] SStanchfieldGCarrollton [place] [address]Phone # [phone] [phone]

## 2021-11-27 ENCOUNTER — Ambulatory Visit: Payer: 59 | Admitting: Rehabilitation

## 2021-11-27 ENCOUNTER — Encounter: Payer: Self-pay | Admitting: Rehabilitation

## 2021-11-27 DIAGNOSIS — D0511 Intraductal carcinoma in situ of right breast: Secondary | ICD-10-CM | POA: Diagnosis not present

## 2021-11-27 DIAGNOSIS — M25611 Stiffness of right shoulder, not elsewhere classified: Secondary | ICD-10-CM

## 2021-11-27 DIAGNOSIS — Z483 Aftercare following surgery for neoplasm: Secondary | ICD-10-CM

## 2021-11-27 NOTE — Therapy (Signed)
OUTPATIENT PHYSICAL THERAPY ONCOLOGY TREATMENT  Patient Name: Alexandra Archer MRN: [id number] DOB:1966-02-14, 56 y.o., female Today's Date: 11/27/2021   PT End of Session - 11/27/21 0802     Visit Number 9    Number of Visits 10    Date for PT Re-Evaluation 12/05/21    PT Start Time 0800    PT Stop Time 0845    PT Time Calculation (min) 45 min    Activity Tolerance Patient tolerated treatment well    Behavior During Therapy [facility] for tasks assessed/performed                Past Medical History:  Diagnosis Date   Anemia    Anxiety    Asthma    Breast cancer ([place]) 02/23/2021   GERD (gastroesophageal reflux disease)    Glaucoma    Goiter    Hyperlipidemia    Primary hypertension    Seasonal allergic rhinitis    Past Surgical History:  Procedure Laterality Date   BREAST LUMPECTOMY WITH RADIOACTIVE SEED LOCALIZATION Right 03/29/2021   Procedure: RIGHT BREAST LUMPECTOMY WITH RADIOACTIVE SEED LOCALIZATION;  Surgeon: Stark Klein, MD;  Location: [place];  Service: General;  Laterality: Right;  [address] [place]  2012   MENISCUS REPAIR Right 2001   Patient Active Problem List   Diagnosis Date Noted   Severe persistent asthma with acute exacerbation 04/13/2021   Ductal carcinoma in situ (DCIS) of right breast 03/14/2021    PCP: Thayer Ohm, PA  REFERRING PROVIDER: Dr. Benay Pike  REFERRING DIAG: Rt breast cancer  THERAPY DIAG:  Ductal carcinoma in situ (DCIS) of right breast  Aftercare following surgery for neoplasm  Stiffness of right shoulder, not elsewhere classified  ONSET DATE: 03/29/21  Rationale for Evaluation and Treatment Rehabilitation  SUBJECTIVE                                                                                                                                                                                           SUBJECTIVE STATEMENT:  I still only notice it mainly  at night  PERTINENT HISTORY:  Rt breast lumpectomy by Dr. Barry Dienes on 03/29/21 due to DCIS and no lymph nodes removed.  Completed radiation on 06/15/21.  Noted to have large postop seroma "pretty  much occupying the upper outer quadrant". Hx of Rt frozen shoulder.    PAIN:  Are you having pain? No  PRECAUTIONS: Cancer hx  WEIGHT BEARING RESTRICTIONS No  FALLS:  Has patient fallen in last 6 months? 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 +1 tenderness to palpation biceps supraspinatus tendon region  UPPER EXTREMITY AROM/PROM: A/PROM RIGHT   eval  11/09/21  Shoulder extension 50   Shoulder flexion 140 - top of shoulder pain 160  Shoulder abduction 147 - top of shoulder pain 156 - pain  Shoulder internal rotation    Shoulder external rotation 95     (Blank rows = not tested)  A/PROM LEFT   eval  Shoulder extension 62  Shoulder flexion 160  Shoulder abduction 170  Shoulder internal rotation   Shoulder external rotation 105    (Blank rows = not tested)   UPPER EXTREMITY STRENGTH:  Pain with resisted flexion, abduction, ER not able to test due to initial pain  QUICK DASH SURVEY: 59%  TODAY'S TREATMENT: 11/27/2021 Therapeutic Exercises Pulleys for flexion and abduction x2 min each Wall ball circles c/cc 2x10 each Yellow band on wall shoulder clock to 1, 3, and 5  x 10 avoiding pinch  Standing rows and shoulder extension with red tband 2x10 bil Manual Therapy PROM in supine to right shoulder in all planes of motion with scapular depression GH inferior and AP glides in various positions grade IV Sidelying scapular PROM all  direction Release of latissimus with tightness here.    11/22/2021 Therapeutic Exercises Pulleys for flexion and abduction x2 min each Standing rows and shoulder extension with red tband 2x10 bil Supine shoulder flexion with cane with 4# 2x10 Supine chest press with cane with 4# 2x10 Sidelying ABD 2#, 2 x 10   Manual Therapy PROM in supine to right shoulder in all planes of motion with scapular depression GH inferior and AP glides in various positions grade IV  11/20/2021 Therapeutic Exercises Pulleys for flexion and abduction x2 min each Ball roll up wall into flexion x10 but started having pain so stopped returning therapist demo and tactile cues to decrease Rt scapular compensation Standing rows and shoulder extension with red tband 2x10 bil Supine shoulder flexion with cane with 3# 2x10 Supine chest press with cane with 3# 2x10 Sidelying ER 2# x 4, then 1# 2 x 10 , pt reports arm feeling very weak with this today so decr weight Sidelying ABD 2#, 2 x 10   Manual Therapy PROM in supine to right shoulder in all planes of motion with scapular depression Lt S/L for Rt scapular mobs into protraction and retraction   PATIENT EDUCATION:  Education details: per today's session notes Person educated: Patient Education method: Consulting civil engineer, Demonstration, Verbal cues, and Handouts Education comprehension: verbalized understanding, returned demonstration, and verbal cues required   HOME EXERCISE PROGRAM: Access Code: [email or web address] URL: [email or web address] Date: 11/09/2021 Prepared by: Shan Levans  Exercises - Standing Row with Anchored Resistance  - 1 x daily - 3 x weekly - 1-3 sets - 10 reps - 2-3 second hold - Shoulder extension with resistance - Neutral  - 1 x daily - 3 x weekly - 1-3 sets - 10 reps - 2-3 second hold - Doorway Pec Stretch at 90 Degrees Abduction  - 1 x daily - 7 x weekly - 1-3 sets - 3 reps - 20-30 seconds hold - Standing Shoulder External Rotation with  Resistance  -  1 x daily - 3 x weekly - 1-3 sets - 10 reps - 2-3 seconds hold  ASSESSMENT: CLINICAL IMPRESSION: Will add more latissimus STM next visit and maybe foam roller due to latissimus tightness noted.   OBJECTIVE IMPAIRMENTS decreased ROM, decreased strength, and pain.   ACTIVITY LIMITATIONS carrying, lifting, and reach over head  PARTICIPATION LIMITATIONS: none  PERSONAL FACTORS 1 comorbidity: radiation hx  are also affecting patient's functional outcome.   REHAB POTENTIAL: Excellent  CLINICAL DECISION MAKING: Stable/uncomplicated  EVALUATION COMPLEXITY: Low  GOALS: Goals reviewed with patient? Yes  SHORT TERM GOALS: Target date: 10/24/2021    Pt will be ind with initial HEP  Baseline: Goal status: MET on 11/01/21  2.  Pt will obtain compression bra with appropriate foam as needed Baseline:  Goal status: MET on 11/01/21   LONG TERM GOALS: Target date: 11/07/2021    Pt will improve AROM of the Rt shoulder to at least 160deg of flexion and abduction to improve reach Baseline:  Goal status: IN PROGRESS (see above)  2.  Pt will report ability to sleep is improved by at least 75% due to less pain in the shoulder and breast Baseline:  Goal status: IN PROGRESS  3.  Pt will decrease QDASH to <19% Baseline:  Goal status: INITIAL  4.  Pt will be ind with final HEP Baseline:  Goal status: INITIAL   PLAN: PT FREQUENCY: 1-2x/week  PT DURATION: 4 weeks  PLANNED INTERVENTIONS: Therapeutic exercises, Therapeutic activity, Neuromuscular re-education, Patient/Family education, Joint mobilization, DME instructions, Dry Needling, Taping, Ionotophoresis 54m/ml Dexamethasone, Manual therapy, and Re-evaluation  PLAN FOR NEXT SESSION: PROM and release of Rt upper quadrant PRN, improve impingement symptoms with TBraxton Feathers PT 11/27/21 8:47 AM   [facility] [address] SPollockGCattle Creek [place] [address]Phone # [phone]  [phone]

## 2021-11-29 ENCOUNTER — Ambulatory Visit: Payer: 59 | Admitting: Rehabilitation

## 2021-12-05 ENCOUNTER — Other Ambulatory Visit: Payer: Self-pay | Admitting: Hematology and Oncology

## 2021-12-05 ENCOUNTER — Encounter: Payer: Self-pay | Admitting: Hematology and Oncology

## 2021-12-06 ENCOUNTER — Encounter: Payer: Self-pay | Admitting: Rehabilitation

## 2021-12-06 ENCOUNTER — Ambulatory Visit: Payer: 59 | Admitting: Rehabilitation

## 2021-12-06 DIAGNOSIS — M25611 Stiffness of right shoulder, not elsewhere classified: Secondary | ICD-10-CM

## 2021-12-06 DIAGNOSIS — D0511 Intraductal carcinoma in situ of right breast: Secondary | ICD-10-CM | POA: Diagnosis not present

## 2021-12-06 DIAGNOSIS — Z483 Aftercare following surgery for neoplasm: Secondary | ICD-10-CM

## 2021-12-06 NOTE — Therapy (Signed)
OUTPATIENT PHYSICAL THERAPY ONCOLOGY TREATMENT  Patient Name: Alexandra Archer MRN: [id number] DOB:[birth year], 56 y.o., female Today's Date: 12/06/2021   PT End of Session - 12/06/21 0850     Visit Number 10    Number of Visits 10    PT Start Time 0800    PT Stop Time 0849    PT Time Calculation (min) 49 min    Activity Tolerance Patient tolerated treatment well    Behavior During Therapy [facility] for tasks assessed/performed                 Past Medical History:  Diagnosis Date   Anemia    Anxiety    Asthma    Breast cancer ([place]) 02/23/2021   GERD (gastroesophageal reflux disease)    Glaucoma    Goiter    Hyperlipidemia    Primary hypertension    Seasonal allergic rhinitis    Past Surgical History:  Procedure Laterality Date   BREAST LUMPECTOMY WITH RADIOACTIVE SEED LOCALIZATION Right 03/29/2021   Procedure: RIGHT BREAST LUMPECTOMY WITH RADIOACTIVE SEED LOCALIZATION;  Surgeon: Stark Klein, MD;  Location: [place];  Service: General;  Laterality: Right;  [address]  2012   MENISCUS REPAIR Right 2001   Patient Active Problem List   Diagnosis Date Noted   Severe persistent asthma with acute exacerbation 04/13/2021   Ductal carcinoma in situ (DCIS) of right breast 03/14/2021    PCP: Thayer Ohm, PA  REFERRING PROVIDER: Dr. Benay Pike  REFERRING DIAG: Rt breast cancer  THERAPY DIAG:  Ductal carcinoma in situ (DCIS) of right breast  Aftercare following surgery for neoplasm  Stiffness of right shoulder, not elsewhere classified  ONSET DATE: 03/29/21  Rationale for Evaluation and Treatment Rehabilitation  SUBJECTIVE                                                                                                                                                                                           SUBJECTIVE STATEMENT:  Its mostly sleeping now.  It is getting better but slowly.     PERTINENT HISTORY:  Rt breast lumpectomy by Dr. Barry Dienes on 03/29/21 due to DCIS and no lymph nodes removed.  Completed radiation on 06/15/21.  Noted to have large postop seroma "pretty  much occupying the upper outer quadrant". Hx of Rt frozen shoulder.    PAIN:  Are you having pain? No  PRECAUTIONS: Cancer hx  WEIGHT BEARING RESTRICTIONS No  FALLS:  Has patient fallen in last 6 months? 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 +1 tenderness to palpation biceps supraspinatus tendon region  UPPER EXTREMITY AROM/PROM: A/PROM RIGHT   eval  11/09/21 12/06/21  Shoulder extension 50    Shoulder flexion 140 - top of shoulder pain 160 155 - outside shoulder pain  Shoulder abduction 147 - top of shoulder pain 156 - pain 155 - outside shoulder pain  Shoulder internal rotation     Shoulder external rotation 95      (Blank rows = not tested)  A/PROM LEFT   eval  Shoulder extension 62  Shoulder flexion 160  Shoulder abduction 170  Shoulder internal rotation   Shoulder external rotation 105    (Blank rows = not tested)   UPPER EXTREMITY STRENGTH:     Rt   flexion,   Unable due to pain with pressure Abduction  Unable due to pain ER   4-/5 due to pain IR   5/5   12/06/21:   QUICK DASH SURVEY: 59% on eval, 31% on 12/06/21  TODAY'S TREATMENT:  12/06/2021 Therapeutic Exercises Pulleys for flexion and abduction x2 min each Rechecked goals and ROM/MMT - discussed POC and gave pt recommendation for Dr. Griffin Basil Standing rows and shoulder extension with red tband 2x10 bil Manual Therapy PROM in supine to right shoulder in all planes of motion with  scapular depression GH inferior and AP glides in various positions grade IV  11/27/2021 Therapeutic Exercises Pulleys for flexion and abduction x2 min each Wall ball circles c/cc 2x10 each Yellow band on wall shoulder clock to 1, 3, and 5  x 10 avoiding pinch  Standing rows and shoulder extension with red tband 2x10 bil Manual Therapy PROM in supine to right shoulder in all planes of motion with scapular depression GH inferior and AP glides in various positions grade IV Sidelying scapular PROM all direction Release of latissimus with tightness here.    11/22/2021 Therapeutic Exercises Pulleys for flexion and abduction x2 min each Standing rows and shoulder extension with red tband 2x10 bil Supine shoulder flexion with cane with 4# 2x10 Supine chest press with cane with 4# 2x10 Sidelying ABD 2#, 2 x 10   Manual Therapy PROM in supine to right shoulder in all planes of motion with scapular depression GH inferior and AP glides in various positions grade IV  PATIENT EDUCATION:  Education details: per today's session notes Person educated: Patient Education method: Consulting civil engineer, Demonstration, Verbal cues, and Handouts Education comprehension: verbalized understanding, returned demonstration, and verbal cues required  HOME EXERCISE PROGRAM: Access Code: [email or web address] URL: [email or web address] Date: 11/09/2021 Prepared by: Shan Levans  Exercises - Standing Row with Anchored Resistance  - 1 x daily - 3 x weekly - 1-3 sets - 10 reps - 2-3 second hold - Shoulder extension with resistance - Neutral  - 1 x daily - 3 x weekly - 1-3 sets - 10 reps - 2-3 second hold - Doorway Pec Stretch at 90 Degrees Abduction  - 1 x daily - 7 x weekly - 1-3 sets - 3 reps - 20-30 seconds hold - Standing Shoulder External Rotation with Resistance  - 1 x daily - 3 x weekly - 1-3 sets - 10 reps - 2-3 seconds hold - ER  isometric holds  ASSESSMENT: CLINICAL IMPRESSION: Reassessed goals and status.   QDASH has improved but AROM and MMT still remains about the same level of pain.  Pt also has mostly night pain which is indicative of possible rotator cuff issue.  Pt will reach out to orthopedics next.   OBJECTIVE IMPAIRMENTS decreased ROM, decreased strength, and pain.   ACTIVITY LIMITATIONS carrying, lifting, and reach over head  PARTICIPATION LIMITATIONS: none  PERSONAL FACTORS 1 comorbidity: radiation hx  are also affecting patient's functional outcome.   REHAB POTENTIAL: Excellent  CLINICAL DECISION MAKING: Stable/uncomplicated  EVALUATION COMPLEXITY: Low  GOALS: Goals reviewed with patient? Yes  SHORT TERM GOALS: Target date: 10/24/2021    Pt will be ind with initial HEP  Baseline: Goal status: MET on 11/01/21  2.  Pt will obtain compression bra with appropriate foam as needed Baseline:  Goal status: MET on 11/01/21   LONG TERM GOALS: Target date: 11/07/2021    Pt will improve AROM of the Rt shoulder to at least 160deg of flexion and abduction to improve reach Baseline:  Goal status: NOT MET  2.  Pt will report ability to sleep is improved by at least 75% due to less pain in the shoulder and breast Baseline:  Goal status: NOT MET  3.  Pt will decrease QDASH to <19% Baseline:  Goal status: NOT MET  4.  Pt will be ind with final HEP Baseline:  Goal status: MET   PLAN: PT FREQUENCY: 1-2x/week  PT DURATION: 4 weeks  PLANNED INTERVENTIONS: Therapeutic exercises, Therapeutic activity, Neuromuscular re-education, Patient/Family education, Joint mobilization, DME instructions, Dry Needling, Taping, Ionotophoresis 38m/ml Dexamethasone, Manual therapy, and Re-evaluation  PLAN FOR NEXT SESSION: PROM and release of Rt upper quadrant PRN, improve impingement symptoms with TBraxton Feathers PT 12/06/21 8:50 AM   [facility] [address] [place] [address]Phone # [phone] [phone] PHYSICAL THERAPY  DISCHARGE SUMMARY  Visits from Start of Care: 10  Current functional level related to goals / functional outcomes: See above   Remaining deficits: See above   Education / Equipment: Final HEP  Plan: Patient agrees to discharge.  Patient goals were not met.

## 2021-12-19 ENCOUNTER — Ambulatory Visit
Admission: RE | Admit: 2021-12-19 | Discharge: 2021-12-19 | Disposition: A | Discharge status: Home / Self Care | Payer: 59 | Source: Ambulatory Visit | Attending: Hematology and Oncology | Admitting: Hematology and Oncology

## 2021-12-19 DIAGNOSIS — D0511 Intraductal carcinoma in situ of right breast: Secondary | ICD-10-CM

## 2021-12-27 ENCOUNTER — Other Ambulatory Visit: Payer: Self-pay

## 2021-12-27 ENCOUNTER — Inpatient Hospital Stay: Payer: 59 | Attending: Hematology and Oncology | Admitting: Adult Health

## 2021-12-27 ENCOUNTER — Inpatient Hospital Stay: Payer: 59

## 2021-12-27 VITALS — BP 147/79 | HR 76 | Temp 98.4°F | Resp 16 | Ht 67.0 in | Wt 163.2 lb

## 2021-12-27 DIAGNOSIS — K219 Gastro-esophageal reflux disease without esophagitis: Secondary | ICD-10-CM | POA: Insufficient documentation

## 2021-12-27 DIAGNOSIS — Z170 Estrogen receptor positive status [ER+]: Secondary | ICD-10-CM | POA: Insufficient documentation

## 2021-12-27 DIAGNOSIS — Z8042 Family history of malignant neoplasm of prostate: Secondary | ICD-10-CM | POA: Diagnosis not present

## 2021-12-27 DIAGNOSIS — E785 Hyperlipidemia, unspecified: Secondary | ICD-10-CM | POA: Diagnosis not present

## 2021-12-27 DIAGNOSIS — Z800 Family history of malignant neoplasm of digestive organs: Secondary | ICD-10-CM | POA: Insufficient documentation

## 2021-12-27 DIAGNOSIS — D0511 Intraductal carcinoma in situ of right breast: Secondary | ICD-10-CM

## 2021-12-27 DIAGNOSIS — M816 Localized osteoporosis [Lequesne]: Secondary | ICD-10-CM

## 2021-12-27 DIAGNOSIS — Z79899 Other long term (current) drug therapy: Secondary | ICD-10-CM | POA: Diagnosis not present

## 2021-12-27 DIAGNOSIS — M810 Age-related osteoporosis without current pathological fracture: Secondary | ICD-10-CM | POA: Insufficient documentation

## 2021-12-27 DIAGNOSIS — Z79811 Long term (current) use of aromatase inhibitors: Secondary | ICD-10-CM | POA: Insufficient documentation

## 2021-12-27 DIAGNOSIS — Z923 Personal history of irradiation: Secondary | ICD-10-CM | POA: Insufficient documentation

## 2021-12-27 DIAGNOSIS — I10 Essential (primary) hypertension: Secondary | ICD-10-CM | POA: Insufficient documentation

## 2021-12-27 LAB — CMP (CANCER CENTER ONLY)
ALT: 14 U/L (ref 0–44)
AST: 17 U/L (ref 15–41)
Albumin: 4.5 g/dL (ref 3.5–5.0)
Alkaline Phosphatase: 98 U/L (ref 38–126)
Anion gap: 5 (ref 5–15)
BUN: 16 mg/dL (ref 6–20)
CO2: 32 mmol/L (ref 22–32)
Calcium: 10.1 mg/dL (ref 8.9–10.3)
Chloride: 102 mmol/L (ref 98–111)
Creatinine: 0.97 mg/dL (ref 0.44–1.00)
GFR, Estimated: >60 mL/min (ref >60)
Glucose, Bld: 102 mg/dL — ABNORMAL HIGH (ref 70–99)
Potassium: 3.6 mmol/L (ref 3.5–5.1)
Sodium: 139 mmol/L (ref 135–145)
Total Bilirubin: 0.2 mg/dL — ABNORMAL LOW (ref 0.3–1.2)
Total Protein: 7.9 g/dL (ref 6.5–8.1)

## 2021-12-27 LAB — VITAMIN D 25 HYDROXY (VIT D DEFICIENCY, FRACTURES): Vit D, 25-Hydroxy: 41.43 ng/mL (ref 30–100)

## 2021-12-27 NOTE — Progress Notes (Unsigned)
SURVIVORSHIP VISIT:  BRIEF ONCOLOGIC HISTORY:  Oncology History Overview Note  Nov 2022: She was found to have screen detected right breast calcifications.Diagnostic imaging confirmed this. There was a 1.4 cm group of coarse calcifications in the UOQ. There was a benign cyst at 1 o'clock 7 mm in greatest dimension. 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  She had a right breast lumpectomy by Dr. Barry Dienes on 03/29/2021 and pathology from this showed ductal carcinoma in situ, high-grade, 1.3 cm, margins uninvolved by carcinoma, prognostic showed ER positive, 95%, moderate, PR negative.   Ductal carcinoma in situ (DCIS) of right breast  03/14/2021 Initial Diagnosis   Ductal carcinoma in situ (DCIS) of right breast   03/29/2021 Surgery   A. BREAST, RIGHT, LUMPECTOMY:  -  Ductal carcinoma in situ, high grade, 1.3 cm  -  Margins uninvolved by carcinoma (see part B)  -  Previous biopsy site changes  -  See oncology table below   B. BREAST, RIGHT ADDITIONAL MEDIAL MARGIN, EXCISION:  -  No residual carcinoma identified    05/21/2021 - 06/15/2021 Radiation Therapy   Site Technique Total Dose (Gy) Dose per Fx (Gy) Completed Fx Beam Energies  Breast, Right: Breast_R 3D 42.56/42.56 2.66 16/16 6X, 10X  Breast, Right: Breast_R_Bst 3D 8/8 2 4/4 6X, 10X     09/2021 -  Anti-estrogen oral therapy   Anastrozole     INTERVAL HISTORY:  Ms. Butrick to review her survivorship care plan detailing her treatment course for breast cancer, as well as monitoring long-term side effects of that treatment, education regarding health maintenance, screening, and overall wellness and health promotion.     Overall, Ms. Heumann reports feeling quite well.  She is taking anastrozole daily and is tolerating it moderately well.  She underwent bone density testing on 12/19/2021 that showed osteoporosis with a t score of -3.4 in her spine.     REVIEW OF SYSTEMS:  Review of Systems   Constitutional:  Negative for appetite change, chills, fatigue, fever and unexpected weight change.  HENT:   Negative for hearing loss, lump/mass and trouble swallowing.   Eyes:  Negative for eye problems and icterus.  Respiratory:  Negative for chest tightness, cough and shortness of breath.   Cardiovascular:  Negative for chest pain, leg swelling and palpitations.  Gastrointestinal:  Negative for abdominal distention, abdominal pain, constipation, diarrhea, nausea and vomiting.  Endocrine: Negative for hot flashes.  Genitourinary:  Negative for difficulty urinating.   Musculoskeletal:  Negative for arthralgias.  Skin:  Negative for itching and rash.  Neurological:  Negative for dizziness, extremity weakness, headaches and numbness.  Hematological:  Negative for adenopathy. Does not bruise/bleed easily.  Psychiatric/Behavioral:  Negative for depression. The patient is not nervous/anxious.    Breast: Denies any new nodularity, masses, tenderness, nipple changes, or nipple discharge.      ONCOLOGY TREATMENT TEAM:  1. Surgeon:  Dr. Barry Dienes at [facility] 2. Medical Oncologist: Dr. Chryl Heck  3. Radiation Oncologist: Dr. Lisbeth Renshaw    PAST MEDICAL/SURGICAL HISTORY:  Past Medical History:  Diagnosis Date   Anemia    Anxiety    Asthma    Breast cancer ([place]) 02/23/2021   GERD (gastroesophageal reflux disease)    Glaucoma    Goiter    Hyperlipidemia    Primary hypertension    Seasonal allergic rhinitis    Past Surgical History:  Procedure Laterality Date   BREAST LUMPECTOMY WITH RADIOACTIVE SEED LOCALIZATION Right 03/29/2021   Procedure:  RIGHT BREAST LUMPECTOMY WITH RADIOACTIVE SEED LOCALIZATION;  Surgeon: Stark Klein, MD;  Location: [facility];  Service: General;  Laterality: Right;  [place]  2012   MENISCUS REPAIR Right 2001     ALLERGIES:  No Known Allergies   CURRENT MEDICATIONS:  Outpatient  Encounter Medications as of 12/27/2021  Medication Sig   albuterol (VENTOLIN HFA) 108 (90 Base) MCG/ACT inhaler Inhale 1-2 puffs into the lungs every 4 (four) hours as needed for wheezing or shortness of breath.   amLODipine (NORVASC) 5 MG tablet Take 5 mg by mouth daily.   anastrozole (ARIMIDEX) 1 MG tablet TAKE 1 TABLET BY MOUTH EVERY DAY   Cholecalciferol (VITAMIN D3) 50 MCG (2000 UT) CAPS Take 2,000 Units by mouth daily.   fluticasone-salmeterol (ADVAIR DISKUS) 250-50 MCG/ACT AEPB Inhale 1 puff into the lungs in the morning and at bedtime.   latanoprost (XALATAN) 0.005 % ophthalmic solution Place 1 drop into both eyes at bedtime.   omeprazole (PRILOSEC) 40 MG capsule Take 40 mg by mouth daily.   traZODone (DESYREL) 50 MG tablet Take 50 mg by mouth at bedtime as needed for sleep.   ALPRAZolam (XANAX) 0.25 MG tablet Take 0.25 mg by mouth daily as needed. (Patient not taking: Reported on 12/27/2021)   fluticasone (FLONASE) 50 MCG/ACT nasal spray Place 1 spray into both nostrils daily. (Patient not taking: Reported on 12/27/2021)   ipratropium (ATROVENT) 0.06 % nasal spray Place 2 sprays into both nostrils 4 (four) times daily. (Patient not taking: Reported on 12/27/2021)   [DISCONTINUED] Ascorbic Acid (VITAMIN C) 100 MG tablet Take 100 mg by mouth daily.   [DISCONTINUED] Cyanocobalamin (B-12) 100 MCG TABS Take 100 mcg by mouth daily.   No facility-administered encounter medications on file as of 12/27/2021.     ONCOLOGIC FAMILY HISTORY:  Family History  Problem Relation Age of Onset   Esophageal cancer Mother    Stomach cancer Father    Heart attack Father 37   Heart attack Brother 74   Prostate cancer Paternal Grandfather      SOCIAL HISTORY:  Social History   Socioeconomic History   Marital status: Married    Spouse name: Not on file   Number of children: Not on file   Years of education: Not on file   Highest education level: Not on file  Occupational History   Not on file   Tobacco Use   Smoking status: Never    Passive exposure: Never   Smokeless tobacco: Never  Substance and Sexual Activity   Alcohol use: Yes    Comment: Social   Drug use: Not on file   Sexual activity: Yes  Other Topics Concern   Not on file  Social History Narrative   Lives at home with husband.     Social Determinants of Health   Financial Resource Strain: Not on file  Food Insecurity: Not on file  Transportation Needs: Not on file  Physical Activity: Not on file  Stress: Not on file  Social Connections: Not on file  Intimate Partner Violence: Not on file     OBSERVATIONS/OBJECTIVE:  BP (!) 147/79 (BP Location: Left Arm, Patient Position: Sitting)   Pulse 76   Temp 98.4 F (36.9 C) (Temporal)   Resp 16   Ht '5\' 7"'  (1.702 m)   Wt 163 lb 3.2 oz (74 kg)   SpO2 100%   BMI 25.56 kg/m  GENERAL: Patient is a  well appearing female in no acute distress HEENT:  Sclerae anicteric.  Oropharynx clear and moist. No ulcerations or evidence of oropharyngeal candidiasis. Neck is supple.  NODES:  No cervical, supraclavicular, or axillary lymphadenopathy palpated.  BREAST EXAM:  right breast s/p lumpectomy and radiation, no sign of local recurrence, left breast is benign LUNGS:  Clear to auscultation bilaterally.  No wheezes or rhonchi. HEART:  Regular rate and rhythm. No murmur appreciated. ABDOMEN:  Soft, nontender.  Positive, normoactive bowel sounds. No organomegaly palpated. MSK:  No focal spinal tenderness to palpation. Full range of motion bilaterally in the upper extremities. EXTREMITIES:  No peripheral edema.   SKIN:  Clear with no obvious rashes or skin changes. No nail dyscrasia. NEURO:  Nonfocal. Well oriented.  Appropriate affect.   LABORATORY DATA:  None for this visit.  DIAGNOSTIC IMAGING:  None for this visit.      ASSESSMENT AND PLAN:  Ms.. Hurlbutt is a pleasant 56 y.o. female with Stage 0 right breast DCIS, ER+/PR+/HER2-, diagnosed in 02/2021, treated with  lumpectomy, adjuvant radiation therapy, and anti-estrogen therapy with Anastrozole beginning in 09/2021.  She presents to the Survivorship Clinic for our initial meeting and routine follow-up post-completion of treatment for breast cancer.    1. Stage 0 right breast cancer:  Ms. Tietje is continuing to recover from definitive treatment for breast cancer. She will follow-up with her medical oncologist, Dr. Chryl Heck in 6 months with history and physical exam per surveillance protocol.  She will continue her anti-estrogen therapy with anastrozole. Thus far, she is tolerating the Anastrozole moderately well.. Her mammogram is due 01/2022; orders placed today. Today, a comprehensive survivorship care plan and treatment summary was reviewed with the patient today detailing her breast cancer diagnosis, treatment course, potential late/long-term effects of treatment, appropriate follow-up care with recommendations for the future, and patient education resources.  A copy of this summary, along with a letter will be sent to the patient's primary care provider via mail/fax/In Basket message after today's visit.    2. Bone health:  Given Ms. Richman's age/history of breast cancer and her current treatment regimen including anti-estrogen therapy with Anastrozole, she is at risk for bone demineralization.  Her last DEXA scan was 12/19/2021 and showed osteoporosis with a t score of -3.4 in the spine.  We talked about bone health in detail and discussed calcium, vitamin d, and weight bearing exercises.  I gave her a handout and we will check her cmp and vitamin d level today to ensure they are optimized for her optimal bone health.  She was given education on specific activities to promote bone health.  3. Cancer screening:  Due to Ms. Monsanto's history and her age, she should receive screening for skin cancers, colon cancer, and gynecologic cancers.  The information and recommendations are listed on the patient's comprehensive care  plan/treatment summary and were reviewed in detail with the patient.    4. Health maintenance and wellness promotion: Ms. Somes was encouraged to consume 5-7 servings of fruits and vegetables per day. We reviewed the "Nutrition Rainbow" handout.  She was also encouraged to engage in moderate to vigorous exercise for 30 minutes per day most days of the week. We discussed the [employer] fitness program, which is designed for cancer survivors to help them become more physically fit after cancer treatments.  She was instructed to limit her alcohol consumption and continue to abstain from tobacco use.     5. Support services/counseling: It is not uncommon for  this period of the patient's cancer care trajectory to be one of many emotions and stressors.  She was given information regarding our available services and encouraged to contact me with any questions or for help enrolling in any of our support group/programs.    Follow up instructions:    -Return to cancer center in 6 months for labs and f/u with Dr. Chryl Heck  -Mammogram due in 01/2022 -Bone density testing in 12/2023 -She is welcome to return back to the Survivorship Clinic at any time; no additional follow-up needed at this time.  -Consider referral back to survivorship as a long-term survivor for continued surveillance  The patient was provided an opportunity to ask questions and all were answered. The patient agreed with the plan and demonstrated an understanding of the instructions.   Total encounter time:30 minutes*in face-to-face visit time, chart review, lab review, care coordination, order entry, and documentation of the encounter time.  Wilber Bihari, NP 12/27/21 1:59 PM Medical Oncology and Hematology [facility] [place], [place] [address] Tel. [phone]    Fax. [phone]  *Total Encounter Time as defined by the Centers for Medicare and Medicaid Services includes, in addition to the  face-to-face time of a patient visit (documented in the note above) non-face-to-face time: obtaining and reviewing outside history, ordering and reviewing medications, tests or procedures, care coordination (communications with other health care professionals or caregivers) and documentation in the medical record.

## 2021-12-27 NOTE — Patient Instructions (Signed)
Bone Health Bones protect organs, store calcium, anchor muscles, and support the whole body. Keeping your bones strong is important, especially as you get older. You can take actions to help keep your bones strong and healthy. Why is keeping my bones healthy important?  Keeping your bones healthy is important because your body constantly replaces bone cells. Cells get old, and new cells take their place. As we age, we lose bone cells because the body may not be able to make enough new cells to replace the old cells. The amount of bone cells and bone tissue you have is referred to as bone mass. The higher your bone mass, the stronger your bones. The aging process leads to an overall loss of bone mass in the body, which can increase the likelihood of: Broken bones. A condition in which the bones become weak and brittle (osteoporosis). A large decline in bone mass occurs in older adults. In women, it occurs about the time of menopause. What actions can I take to keep my bones healthy? Good health habits are important for maintaining healthy bones. This includes eating nutritious foods and exercising regularly. To have healthy bones, you need to get enough of the right minerals and vitamins. Most nutrition experts recommend getting these nutrients from the foods that you eat. In some cases, taking supplements may also be recommended. Doing certain types of exercise is also important for bone health. What are the nutritional recommendations for healthy bones?  Eating a well-balanced diet with plenty of calcium and vitamin D will help to protect your bones. Nutritional recommendations vary from person to person. Ask your health care provider what is healthy for you. Here are some general guidelines. Get enough calcium Calcium is the most important (essential) mineral for bone health. Most people can get enough calcium from their diet, but supplements may be recommended for people who are at risk for  osteoporosis. Good sources of calcium include: Dairy products, such as low-fat or nonfat milk, cheese, and yogurt. Dark green leafy vegetables, such as bok choy and broccoli. Foods that have calcium added to them (are fortified). Foods that may be fortified with calcium include orange juice, cereal, bread, soy beverages, and tofu products. Nuts, such as almonds. Follow these recommended amounts for daily calcium intake: Infants, 0-6 months: 200 mg. Infants, 6-12 months: 260 mg. Children, age 1-3: 700 mg. Children, age 4-8: 1,000 mg. Children, age 9-13: 1,300 mg. Teens, age 14-18: 1,300 mg. Adults, age 19-50: 1,000 mg. Adults, age 51-70: Men: 1,000 mg. Women: 1,200 mg. Adults, age 71 or older: 1,200 mg. Pregnant and breastfeeding females: Teens: 1,300 mg. Adults: 1,000 mg. Get enough vitamin D Vitamin D is the most essential vitamin for bone health. It helps the body absorb calcium. Sunlight stimulates the skin to make vitamin D, so be sure to get enough sunlight. If you live in a cold climate or you do not get outside often, your health care provider may recommend that you take vitamin D supplements. Good sources of vitamin D in your diet include: Egg yolks. Saltwater fish. Milk and cereal fortified with vitamin D. Follow these recommended amounts for daily vitamin D intake: Infants, 0-12 months: 400 international units (IU). Children and teens, age 1-18: 600 international units. Adults, age 59 or younger: 600 international units. Adults, age 60 or older: 600-1,000 international units. Get other important nutrients Other nutrients that are important for bone health include: Phosphorus. This mineral is found in meat, poultry, dairy foods, nuts, and legumes. The   recommended daily intake for adult men and adult women is 700 mg. Magnesium. This mineral is found in seeds, nuts, dark green vegetables, and legumes. The recommended daily intake for adult men is 400-420 mg. For adult women,  it is 310-320 mg. Vitamin K. This vitamin is found in green leafy vegetables. The recommended daily intake is 120 mcg for adult men and 90 mcg for adult women. What type of physical activity is best for building and maintaining healthy bones? Weight-bearing and strength-building activities are important for building and maintaining healthy bones. Weight-bearing activities cause muscles and bones to work against gravity. Strength-building activities increase the strength of the muscles that support bones. Weight-bearing and muscle-building activities include: Walking and hiking. Jogging and running. Dancing. Gym exercises. Lifting weights. Tennis and racquetball. Climbing stairs. Aerobics. Adults should get at least 30 minutes of moderate physical activity on most days. Children should get at least 60 minutes of moderate physical activity on most days. Ask your health care provider what type of exercise is best for you. How can I find out if my bone mass is low? Bone mass can be measured with an X-ray test called a bone mineral density (BMD) test. This test is recommended for all women who are age 65 or older. It may also be recommended for: Men who are age 70 or older. People who are at risk for osteoporosis because of: Having a long-term disease that weakens bones, such as kidney disease or rheumatoid arthritis. Having menopause earlier than normal. Taking medicine that weakens bones, such as steroids, thyroid hormones, or hormone treatment for breast cancer or prostate cancer. Smoking. Drinking three or more alcoholic drinks a day. Being underweight. Sedentary lifestyle. If you find that you have a low bone mass, you may be able to prevent osteoporosis or further bone loss by changing your diet and lifestyle. Where can I find more information? Bone Health & Osteoporosis Foundation: www.nof.org/patients National Institutes of Health: www.bones.nih.gov International Osteoporosis  Foundation: www.iofbonehealth.org Summary The aging process leads to an overall loss of bone mass in the body, which can increase the likelihood of broken bones and osteoporosis. Eating a well-balanced diet with plenty of calcium and vitamin D will help to protect your bones. Weight-bearing and strength-building activities are also important for building and maintaining strong bones. Bone mass can be measured with an X-ray test called a bone mineral density (BMD) test. This information is not intended to replace advice given to you by your health care provider. Make sure you discuss any questions you have with your health care provider. Document Revised: 09/13/2020 Document Reviewed: 09/13/2020 Elsevier Patient Education  2023 Elsevier Inc.  

## 2022-01-16 ENCOUNTER — Other Ambulatory Visit: Payer: Self-pay | Admitting: Hematology and Oncology

## 2022-01-16 ENCOUNTER — Other Ambulatory Visit: Payer: Self-pay | Admitting: General Surgery

## 2022-01-16 DIAGNOSIS — D0511 Intraductal carcinoma in situ of right breast: Secondary | ICD-10-CM

## 2022-01-17 ENCOUNTER — Other Ambulatory Visit: Payer: Self-pay | Admitting: General Surgery

## 2022-01-17 DIAGNOSIS — Z170 Estrogen receptor positive status [ER+]: Secondary | ICD-10-CM

## 2022-01-23 ENCOUNTER — Other Ambulatory Visit: Payer: Self-pay | Admitting: Hematology and Oncology

## 2022-01-23 ENCOUNTER — Other Ambulatory Visit: Payer: Self-pay | Admitting: General Surgery

## 2022-01-23 DIAGNOSIS — D0511 Intraductal carcinoma in situ of right breast: Secondary | ICD-10-CM

## 2022-01-28 ENCOUNTER — Ambulatory Visit
Admission: RE | Admit: 2022-01-28 | Discharge: 2022-01-28 | Disposition: A | Discharge status: Home / Self Care | Payer: 59 | Source: Ambulatory Visit | Attending: General Surgery | Admitting: General Surgery

## 2022-01-28 ENCOUNTER — Ambulatory Visit
Admission: RE | Admit: 2022-01-28 | Discharge: 2022-01-28 | Disposition: A | Discharge status: Home / Self Care | Payer: 59 | Source: Ambulatory Visit | Attending: Hematology and Oncology | Admitting: Hematology and Oncology

## 2022-01-28 DIAGNOSIS — D0511 Intraductal carcinoma in situ of right breast: Secondary | ICD-10-CM

## 2022-01-28 HISTORY — DX: Personal history of irradiation: Z92.3

## 2022-01-29 IMAGING — MG MM PLC BREAST LOC DEV 1ST LESION INC*R*
8 series · 8 of 8 positions shown · non-contrast
Comparison: Previous exam(s).

CLINICAL DATA: 55-year-old with biopsy-proven high-grade DCIS
involving the outer RIGHT breast at far posterior depth, slight
UPPER OUTER QUADRANT (X clip). Radioactive seed localization is
performed in anticipation of lumpectomy tomorrow.

EXAM:
MAMMOGRAPHIC GUIDED RADIOACTIVE SEED LOCALIZATION OF THE RIGHT
BREAST

[R XCCL (1 of 5)]
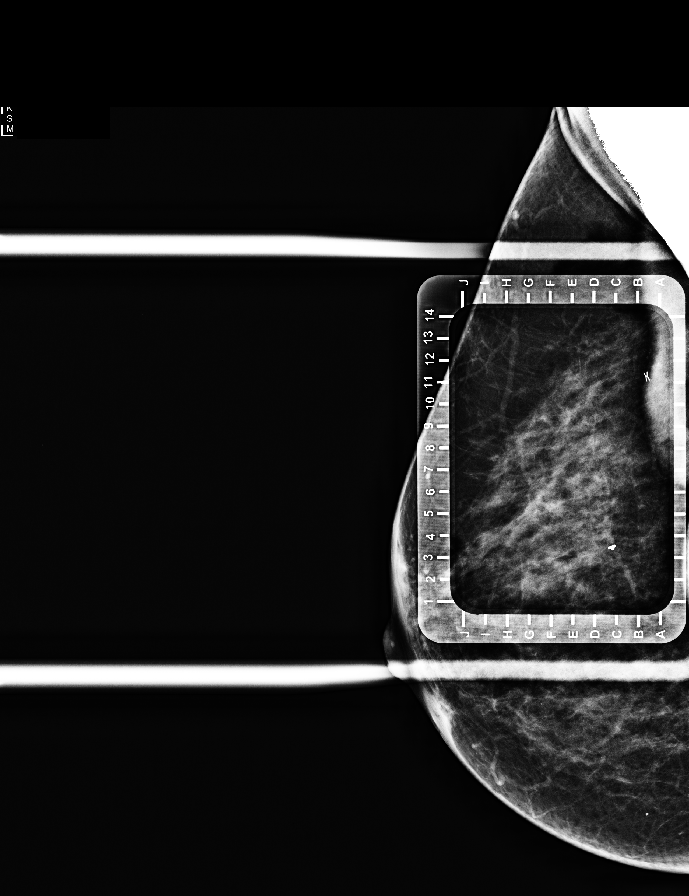

[R XCCL (2 of 5)]
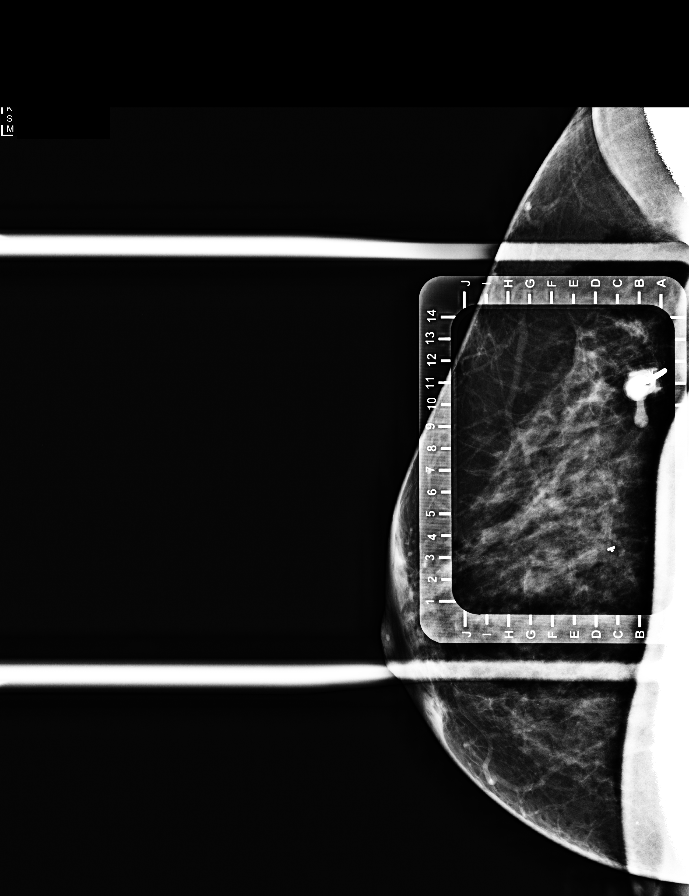

[R LM (1 of 3)]
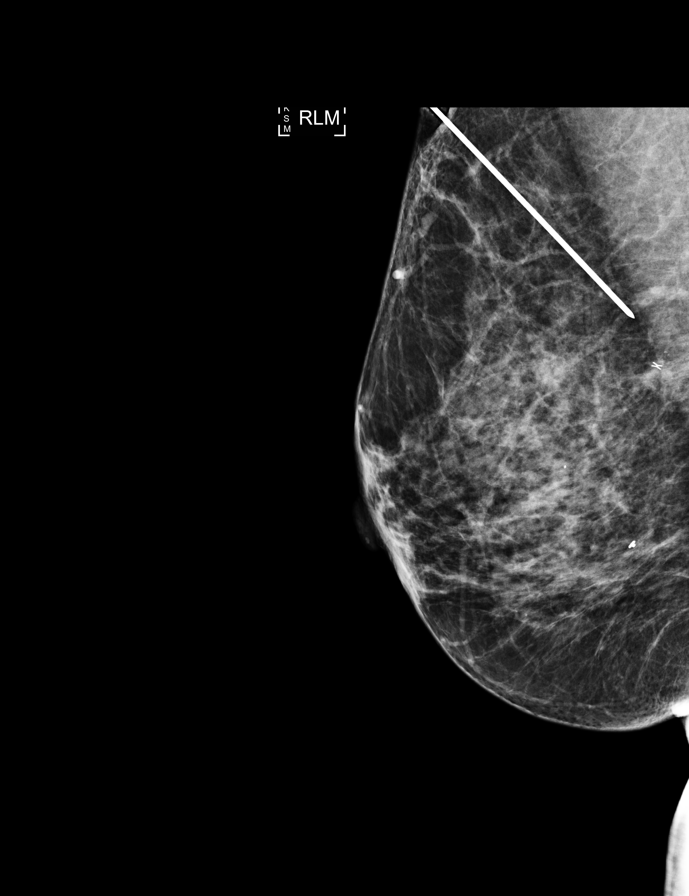

[R LM (2 of 3)]
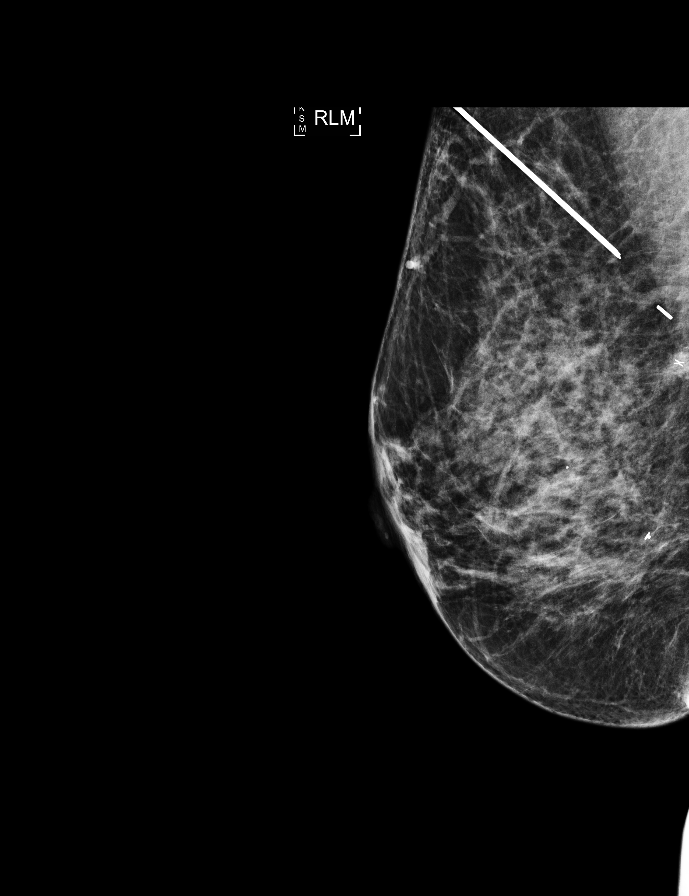

[R XCCL (3 of 5)]
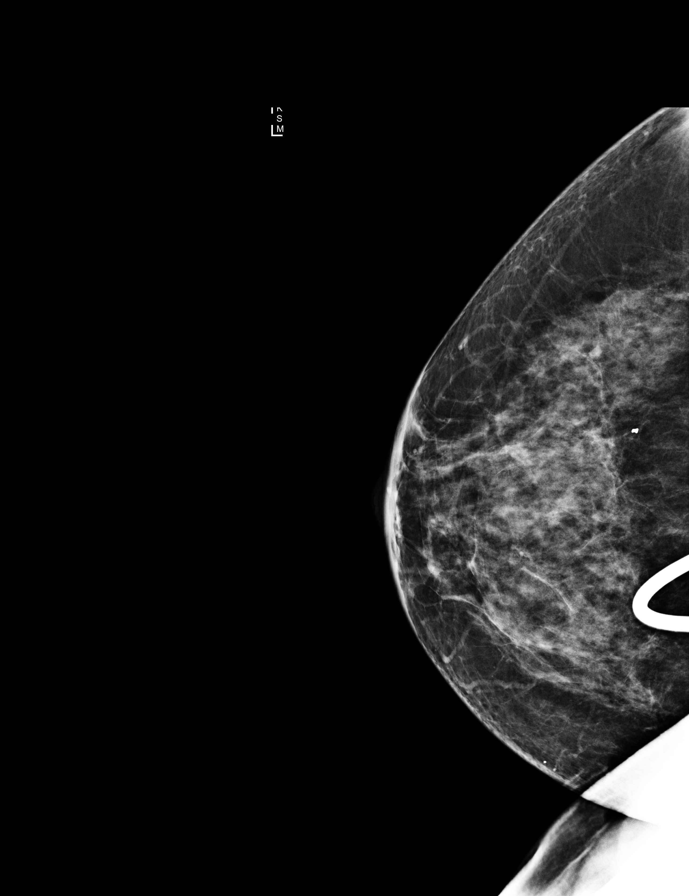

[R XCCL (4 of 5)]
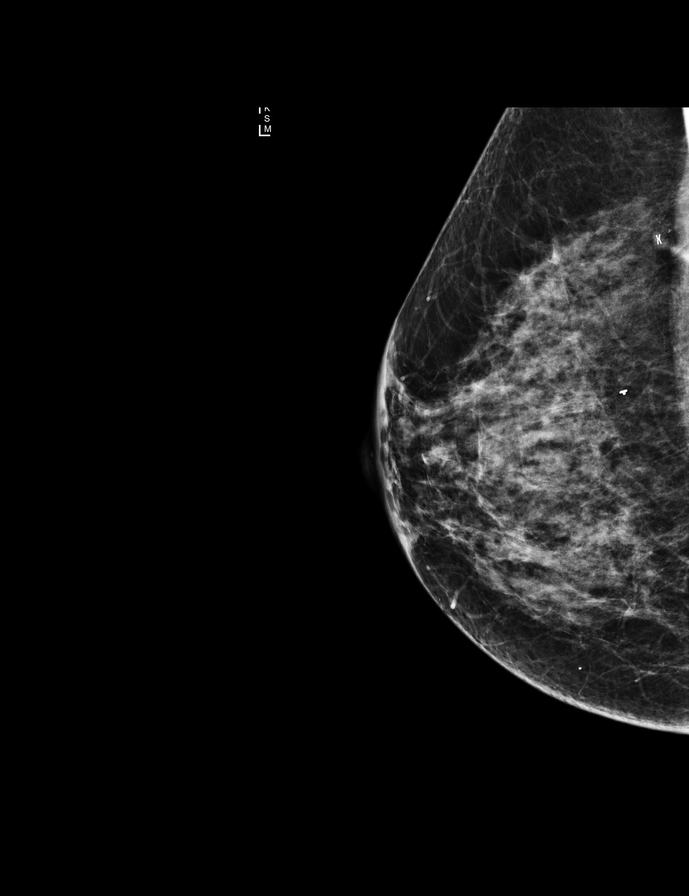

[R XCCL (5 of 5)]
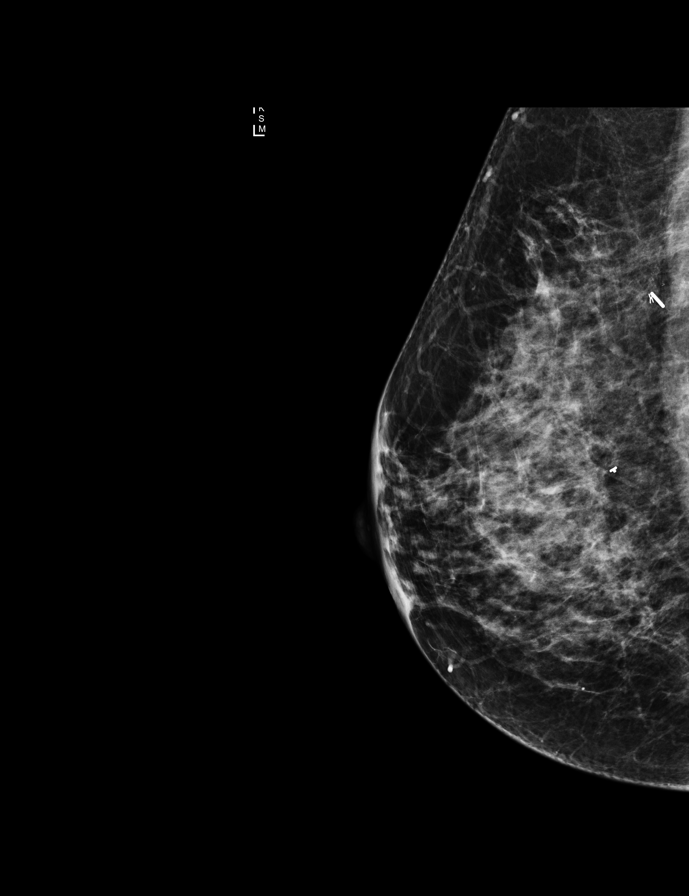

[R LM (3 of 3)]
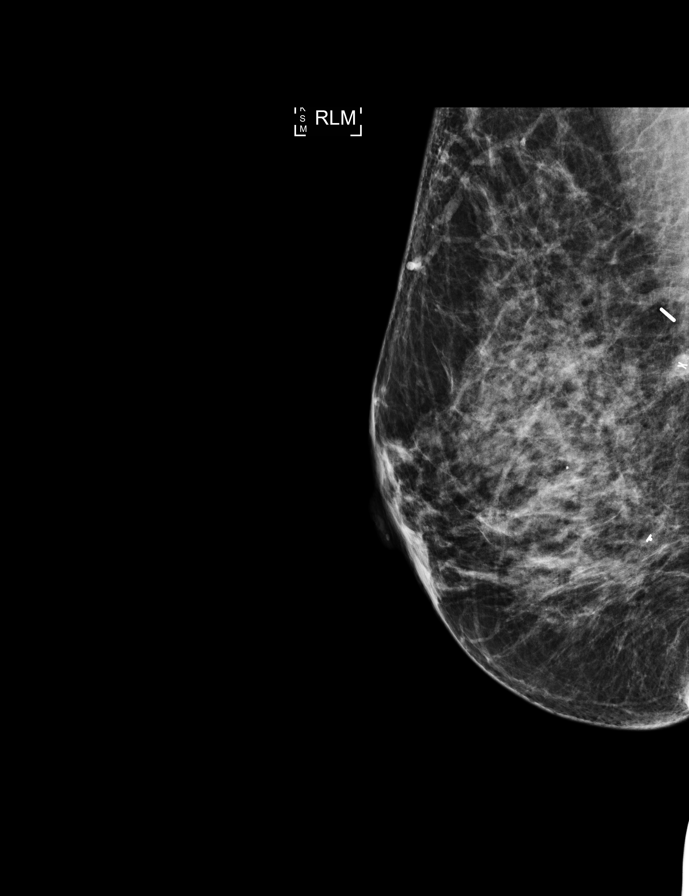

[8 of 8 positions shown; findings below may reference images not displayed]

FINDINGS: Patient presents for radioactive seed localization prior to RIGHT
breast lumpectomy. I met with the patient and we discussed the
procedure of seed localization including benefits and alternatives.
We discussed the high likelihood of a successful procedure. We
discussed the risks of the procedure including infection, bleeding,
tissue injury and further surgery. We discussed the low dose of
radioactivity involved in the procedure. Informed, written consent
was given.

The usual time-out protocol was performed immediately prior to the
procedure.

Using mammographic guidance, sterile technique with chlorhexidine as
skin antisepsis, 1% lidocaine as local anesthetic, an [id number]
radioactive seed was used to localize the X shaped tissue marker
clip associated with the calcifications of biopsy-proven DCIS using
a superior approach.

The follow-up mammogram images confirm that the seed is located
approximately 1 cm directly superior to the X clip which is at the
anterior margin of the calcifications. The images are marked and
annotated for Dr. Yakov.

Follow-up survey of the patient confirms the presence of the
radioactive seed.

Order number of [id number] seed: [id number]

Total activity: 0.253 mCi

Reference Date: 03/15/2021

The patient tolerated the procedure well and was released from the
[REDACTED]. She was given instructions regarding seed removal.
IMPRESSION: Radioactive seed localization of biopsy-proven DCIS involving the
outer RIGHT breast at far posterior depth breast. No apparent
complications.

The seed is approximately 1 cm directly superior to the X clip which
is located at the anterior margin of the biopsied calcifications.
The calcifications extend posteriorly to the chest wall.

## 2022-01-30 IMAGING — MG MM BREAST SURGICAL SPECIMEN
1 series · 1 of 1 positions shown · non-contrast
Comparison: Previous exam(s).

CLINICAL DATA: Evaluate specimen after radioactive seed
localization of a right breast lesion.

EXAM:
SPECIMEN RADIOGRAPH OF THE RIGHT BREAST

[R]
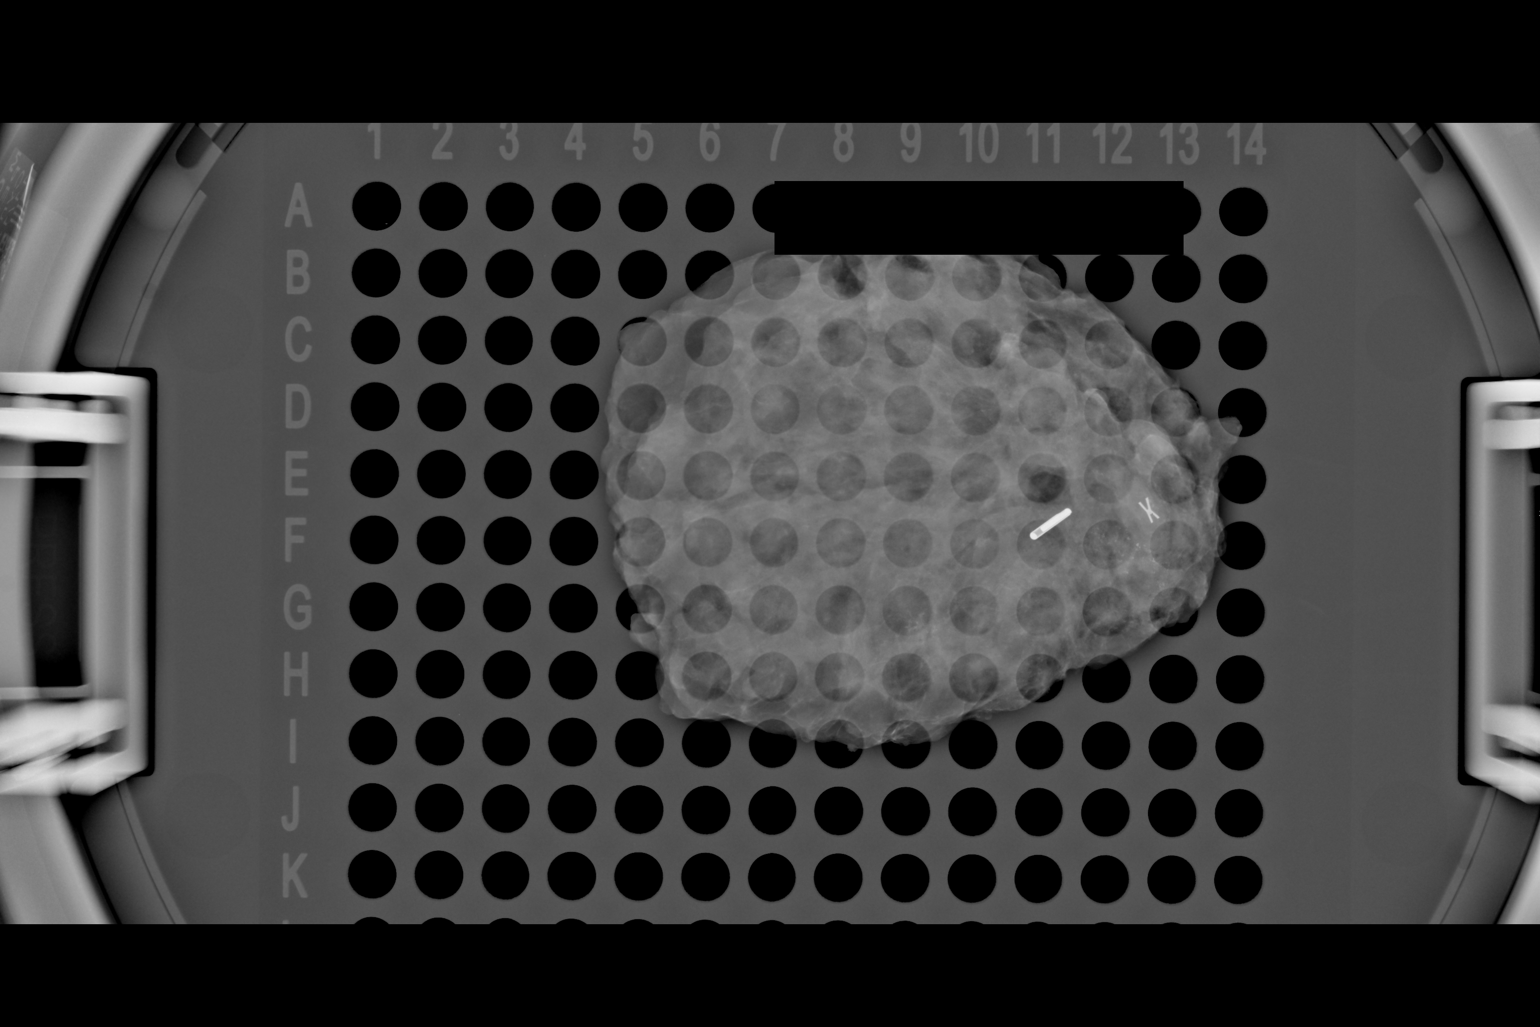

[1 of 1 positions shown; findings below may reference images not displayed]

FINDINGS: Status post excision of the right breast. The radioactive seed and
biopsy marker clip are present, completely intact, and were marked
for pathology.
IMPRESSION: Specimen radiograph of the right breast.

## 2022-01-31 ENCOUNTER — Other Ambulatory Visit: Payer: Self-pay | Admitting: Gastroenterology

## 2022-01-31 DIAGNOSIS — R1310 Dysphagia, unspecified: Secondary | ICD-10-CM

## 2022-02-02 ENCOUNTER — Ambulatory Visit
Admission: RE | Admit: 2022-02-02 | Discharge: 2022-02-02 | Disposition: A | Discharge status: Home / Self Care | Payer: 59 | Source: Ambulatory Visit | Attending: General Surgery | Admitting: General Surgery

## 2022-02-02 DIAGNOSIS — C50411 Malignant neoplasm of upper-outer quadrant of right female breast: Secondary | ICD-10-CM

## 2022-02-02 MED ORDER — GADOPICLENOL 0.5 MMOL/ML IV SOLN
8.0000 mL | Freq: Once | INTRAVENOUS | Status: AC | PRN
  Administered 2022-02-02: 8 mL via INTRAVENOUS

## 2022-02-06 ENCOUNTER — Ambulatory Visit
Admission: RE | Admit: 2022-02-06 | Discharge: 2022-02-06 | Disposition: A | Discharge status: Home / Self Care | Payer: 59 | Source: Ambulatory Visit | Attending: Gastroenterology | Admitting: Gastroenterology

## 2022-02-06 DIAGNOSIS — R1310 Dysphagia, unspecified: Secondary | ICD-10-CM

## 2022-02-18 ENCOUNTER — Other Ambulatory Visit: Payer: 59

## 2022-03-11 ENCOUNTER — Other Ambulatory Visit: Payer: 59

## 2022-03-28 ENCOUNTER — Inpatient Hospital Stay (HOSPITAL_BASED_OUTPATIENT_CLINIC_OR_DEPARTMENT_OTHER): Payer: 59 | Admitting: Hematology and Oncology

## 2022-03-28 ENCOUNTER — Ambulatory Visit: Payer: 59 | Admitting: Hematology and Oncology

## 2022-03-28 ENCOUNTER — Inpatient Hospital Stay: Payer: 59 | Attending: Hematology and Oncology

## 2022-03-28 VITALS — BP 123/78 | HR 80 | Temp 97.3°F | Resp 16 | Wt 163.5 lb

## 2022-03-28 DIAGNOSIS — D0511 Intraductal carcinoma in situ of right breast: Secondary | ICD-10-CM | POA: Diagnosis present

## 2022-03-28 DIAGNOSIS — Z79811 Long term (current) use of aromatase inhibitors: Secondary | ICD-10-CM | POA: Diagnosis not present

## 2022-03-28 DIAGNOSIS — I10 Essential (primary) hypertension: Secondary | ICD-10-CM | POA: Insufficient documentation

## 2022-03-28 DIAGNOSIS — Z79899 Other long term (current) drug therapy: Secondary | ICD-10-CM | POA: Diagnosis not present

## 2022-03-28 DIAGNOSIS — K219 Gastro-esophageal reflux disease without esophagitis: Secondary | ICD-10-CM | POA: Insufficient documentation

## 2022-03-28 DIAGNOSIS — Z923 Personal history of irradiation: Secondary | ICD-10-CM | POA: Insufficient documentation

## 2022-03-28 DIAGNOSIS — M810 Age-related osteoporosis without current pathological fracture: Secondary | ICD-10-CM | POA: Diagnosis not present

## 2022-03-28 DIAGNOSIS — R4586 Emotional lability: Secondary | ICD-10-CM | POA: Diagnosis not present

## 2022-03-28 DIAGNOSIS — Z7951 Long term (current) use of inhaled steroids: Secondary | ICD-10-CM | POA: Diagnosis not present

## 2022-03-28 DIAGNOSIS — E785 Hyperlipidemia, unspecified: Secondary | ICD-10-CM | POA: Insufficient documentation

## 2022-03-28 LAB — CBC WITH DIFFERENTIAL/PLATELET
Abs Immature Granulocytes: 0.01 10*3/uL (ref 0.00–0.07)
Basophils Absolute: 0 10*3/uL (ref 0.0–0.1)
Basophils Relative: 0 %
Eosinophils Absolute: 0.1 10*3/uL (ref 0.0–0.5)
Eosinophils Relative: 2 %
HCT: 43.1 % (ref 36.0–46.0)
Hemoglobin: 13.7 g/dL (ref 12.0–15.0)
Immature Granulocytes: 0 %
Lymphocytes Relative: 26 %
Lymphs Abs: 1 10*3/uL (ref 0.7–4.0)
MCH: 24.9 pg — ABNORMAL LOW (ref 26.0–34.0)
MCHC: 31.8 g/dL (ref 30.0–36.0)
MCV: 78.4 fL — ABNORMAL LOW (ref 80.0–100.0)
Monocytes Absolute: 0.3 10*3/uL (ref 0.1–1.0)
Monocytes Relative: 9 %
Neutro Abs: 2.4 10*3/uL (ref 1.7–7.7)
Neutrophils Relative %: 63 %
Platelets: 188 10*3/uL (ref 150–400)
RBC: 5.5 MIL/uL — ABNORMAL HIGH (ref 3.87–5.11)
RDW: 14.3 % (ref 11.5–15.5)
WBC: 3.9 10*3/uL — ABNORMAL LOW (ref 4.0–10.5)
nRBC: 0 % (ref 0.0–0.2)

## 2022-03-28 LAB — COMPREHENSIVE METABOLIC PANEL
ALT: 13 U/L (ref 0–44)
AST: 15 U/L (ref 15–41)
Albumin: 4.4 g/dL (ref 3.5–5.0)
Alkaline Phosphatase: 110 U/L (ref 38–126)
Anion gap: 4 — ABNORMAL LOW (ref 5–15)
BUN: 13 mg/dL (ref 6–20)
CO2: 36 mmol/L — ABNORMAL HIGH (ref 22–32)
Calcium: 10.1 mg/dL (ref 8.9–10.3)
Chloride: 101 mmol/L (ref 98–111)
Creatinine, Ser: 1.15 mg/dL — ABNORMAL HIGH (ref 0.44–1.00)
GFR, Estimated: 56 mL/min — ABNORMAL LOW (ref >60)
Glucose, Bld: 98 mg/dL (ref 70–99)
Potassium: 3.3 mmol/L — ABNORMAL LOW (ref 3.5–5.1)
Sodium: 141 mmol/L (ref 135–145)
Total Bilirubin: 0.3 mg/dL (ref 0.3–1.2)
Total Protein: 7.4 g/dL (ref 6.5–8.1)

## 2022-03-28 NOTE — Progress Notes (Signed)
SURVIVORSHIP VISIT:  BRIEF ONCOLOGIC HISTORY:  Oncology History Overview Note  Nov 2022: She was found to have screen detected right breast calcifications.Diagnostic imaging confirmed this. There was a 1.4 cm group of coarse calcifications in the UOQ. There was a benign cyst at 1 o'clock 7 mm in greatest dimension. 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  She had a right breast lumpectomy by Dr. Barry Dienes on 03/29/2021 and pathology from this showed ductal carcinoma in situ, high-grade, 1.3 cm, margins uninvolved by carcinoma, prognostic showed ER positive, 95%, moderate, PR negative.   Ductal carcinoma in situ (DCIS) of right breast  03/14/2021 Initial Diagnosis   Ductal carcinoma in situ (DCIS) of right breast   03/29/2021 Surgery   A. BREAST, RIGHT, LUMPECTOMY:  -  Ductal carcinoma in situ, high grade, 1.3 cm  -  Margins uninvolved by carcinoma (see part B)  -  Previous biopsy site changes  -  See oncology table below   B. BREAST, RIGHT ADDITIONAL MEDIAL MARGIN, EXCISION:  -  No residual carcinoma identified    05/21/2021 - 06/15/2021 Radiation Therapy   Site Technique Total Dose (Gy) Dose per Fx (Gy) Completed Fx Beam Energies  Breast, Right: Breast_R 3D 42.56/42.56 2.66 16/16 6X, 10X  Breast, Right: Breast_R_Bst 3D 8/8 2 4/4 6X, 10X    09/2021 -  Anti-estrogen oral therapy   Anastrozole   12/27/2021 Cancer Staging   Staging form: Breast, AJCC 8th Edition - Clinical: Stage 0 (cTis (DCIS), cN0, cM0) - Signed by Gardenia Phlegm, NP on 12/27/2021 Stage prefix: Initial diagnosis     INTERVAL HISTORY:   She underwent bone density testing on 12/19/2021 that showed osteoporosis with a t score of -3.4 in her spine.   We discussed about bisphosphonate for management of osteoporosis. She is seeing her dentist in about a week for dental clearance Otherwise she is tolerating anastrozole well.  REVIEW OF SYSTEMS:  Review of Systems   Constitutional:  Negative for appetite change, chills, fatigue, fever and unexpected weight change.  HENT:   Negative for hearing loss, lump/mass and trouble swallowing.   Eyes:  Negative for eye problems and icterus.  Respiratory:  Negative for chest tightness, cough and shortness of breath.   Cardiovascular:  Negative for chest pain, leg swelling and palpitations.  Gastrointestinal:  Negative for abdominal distention, abdominal pain, constipation, diarrhea, nausea and vomiting.  Endocrine: Negative for hot flashes.  Genitourinary:  Negative for difficulty urinating.   Musculoskeletal:  Negative for arthralgias.  Skin:  Negative for itching and rash.  Neurological:  Negative for dizziness, extremity weakness, headaches and numbness.  Hematological:  Negative for adenopathy. Does not bruise/bleed easily.  Psychiatric/Behavioral:  Negative for depression. The patient is not nervous/anxious.    Breast: Denies any new nodularity, masses, tenderness, nipple changes, or nipple discharge.      ONCOLOGY TREATMENT TEAM:  1. Surgeon:  Dr. Barry Dienes at [facility] 2. Medical Oncologist: Dr. Chryl Heck  3. Radiation Oncologist: Dr. Lisbeth Renshaw    PAST MEDICAL/SURGICAL HISTORY:  Past Medical History:  Diagnosis Date   Anemia    Anxiety    Asthma    Breast cancer ([place]) 02/23/2021   GERD (gastroesophageal reflux disease)    Glaucoma    Goiter    Hyperlipidemia    Personal history of radiation therapy    Primary hypertension    Seasonal allergic rhinitis    Past Surgical History:  Procedure Laterality Date   BREAST BIOPSY Right 03/01/2021  BREAST LUMPECTOMY Right 03/29/2021   BREAST LUMPECTOMY WITH RADIOACTIVE SEED LOCALIZATION Right 03/29/2021   Procedure: RIGHT BREAST LUMPECTOMY WITH RADIOACTIVE SEED LOCALIZATION;  Surgeon: Stark Klein, MD;  Location: [place];  Service: General;  Laterality: Right;  [place]  2012    MENISCUS REPAIR Right 2001     ALLERGIES:  No Known Allergies   CURRENT MEDICATIONS:  Outpatient Encounter Medications as of 03/28/2022  Medication Sig   albuterol (VENTOLIN HFA) 108 (90 Base) MCG/ACT inhaler Inhale 1-2 puffs into the lungs every 4 (four) hours as needed for wheezing or shortness of breath.   ALPRAZolam (XANAX) 0.25 MG tablet Take 0.25 mg by mouth daily as needed. (Patient not taking: Reported on 12/27/2021)   amLODipine (NORVASC) 5 MG tablet Take 5 mg by mouth daily.   anastrozole (ARIMIDEX) 1 MG tablet TAKE 1 TABLET BY MOUTH EVERY DAY   Cholecalciferol (VITAMIN D3) 50 MCG (2000 UT) CAPS Take 2,000 Units by mouth daily.   fluticasone (FLONASE) 50 MCG/ACT nasal spray Place 1 spray into both nostrils daily. (Patient not taking: Reported on 12/27/2021)   fluticasone-salmeterol (ADVAIR DISKUS) 250-50 MCG/ACT AEPB Inhale 1 puff into the lungs in the morning and at bedtime.   ipratropium (ATROVENT) 0.06 % nasal spray Place 2 sprays into both nostrils 4 (four) times daily. (Patient not taking: Reported on 12/27/2021)   latanoprost (XALATAN) 0.005 % ophthalmic solution Place 1 drop into both eyes at bedtime.   omeprazole (PRILOSEC) 40 MG capsule Take 40 mg by mouth daily.   traZODone (DESYREL) 50 MG tablet Take 50 mg by mouth at bedtime as needed for sleep.   No facility-administered encounter medications on file as of 03/28/2022.     ONCOLOGIC FAMILY HISTORY:  Family History  Problem Relation Age of Onset   Esophageal cancer Mother    Stomach cancer Father    Heart attack Father 16   Heart attack Brother 51   Prostate cancer Paternal Grandfather      SOCIAL HISTORY:  Social History   Socioeconomic History   Marital status: Married    Spouse name: Not on file   Number of children: Not on file   Years of education: Not on file   Highest education level: Not on file  Occupational History   Not on file  Tobacco Use   Smoking status: Never    Passive exposure:  Never   Smokeless tobacco: Never  Substance and Sexual Activity   Alcohol use: Yes    Comment: Social   Drug use: Not on file   Sexual activity: Yes  Other Topics Concern   Not on file  Social History Narrative   Lives at home with husband.     Social Determinants of Health   Financial Resource Strain: Not on file  Food Insecurity: Not on file  Transportation Needs: Not on file  Physical Activity: Not on file  Stress: Not on file  Social Connections: Not on file  Intimate Partner Violence: Not on file   OBSERVATIONS/OBJECTIVE:  BP 123/78 (BP Location: Left Arm, Patient Position: Sitting)   Pulse 80   Temp (!) 97.3 F (36.3 C) (Temporal)   Resp 16   Wt 163 lb 8 oz (74.2 kg)   SpO2 100%   BMI 25.61 kg/m  GENERAL: Patient is a well appearing female in no acute distress Bilateral breasts inspected.  No palpable masses or regional adenopathy.   LABORATORY DATA:  None for this visit.  DIAGNOSTIC IMAGING:  None for this visit.      ASSESSMENT AND PLAN:   Ms.. Alexandra Archer is a pleasant 56 y.o. female with Stage 0 right breast DCIS, ER+/PR+/HER2-, diagnosed in 02/2021, treated with lumpectomy, adjuvant radiation therapy, and anti-estrogen therapy with Anastrozole beginning in 09/2021.   She completed adjuvant radiation on June 15, 2021, tolerated it relatively well.  She is here for follow-up on anastrozole.  Review of systems today pertinent for labile mood. She didn't report any other symptoms.  She is willing to continue anastrozole at this time. With regards to osteoporosis we have discussed about considering bisphosphonates given her T-score of -3.4.  I have given the dental clearance form.  She is meeting her dentist next week and Alexandra Archer seek his recommendations if she can proceed with bisphosphonate.  She understands the risk of osteonecrosis of the jaw.  We Alexandra Archer schedule a telephone visit in 4 weeks to review dental recommendations and to initiate bisphosphonate therapy.  In  the interim she should continue vitamin D/calcium as tolerated and consider weightbearing exercises.  RTC in 4 weeks for telephone visit in 6 months in person Last MRI in October 2023 with no concerns for breast malignancy.  *Total Encounter Time as defined by the Centers for Medicare and Medicaid Services includes, in addition to the face-to-face time of a patient visit (documented in the note above) non-face-to-face time: obtaining and reviewing outside history, ordering and reviewing medications, tests or procedures, care coordination (communications with other health care professionals or caregivers) and documentation in the medical record.

## 2022-03-30 ENCOUNTER — Encounter: Payer: Self-pay | Admitting: Hematology and Oncology

## 2022-04-25 ENCOUNTER — Inpatient Hospital Stay: Payer: 59 | Attending: Hematology and Oncology | Admitting: Hematology and Oncology

## 2022-04-25 DIAGNOSIS — Z800 Family history of malignant neoplasm of digestive organs: Secondary | ICD-10-CM | POA: Insufficient documentation

## 2022-04-25 DIAGNOSIS — D0511 Intraductal carcinoma in situ of right breast: Secondary | ICD-10-CM | POA: Diagnosis not present

## 2022-04-25 DIAGNOSIS — Z923 Personal history of irradiation: Secondary | ICD-10-CM | POA: Insufficient documentation

## 2022-04-25 DIAGNOSIS — Z79811 Long term (current) use of aromatase inhibitors: Secondary | ICD-10-CM | POA: Insufficient documentation

## 2022-04-25 DIAGNOSIS — M816 Localized osteoporosis [Lequesne]: Secondary | ICD-10-CM

## 2022-04-25 DIAGNOSIS — M810 Age-related osteoporosis without current pathological fracture: Secondary | ICD-10-CM | POA: Insufficient documentation

## 2022-04-25 DIAGNOSIS — Z8042 Family history of malignant neoplasm of prostate: Secondary | ICD-10-CM | POA: Insufficient documentation

## 2022-04-25 NOTE — Progress Notes (Addendum)
SURVIVORSHIP VISIT:  BRIEF ONCOLOGIC HISTORY:  Oncology History Overview Note  Nov 2022: She was found to have screen detected right breast calcifications.Diagnostic imaging confirmed this. There was a 1.4 cm group of coarse calcifications in the UOQ. There was a benign cyst at 1 o'clock 7 mm in greatest dimension. 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  She had a right breast lumpectomy by Dr. Barry Dienes on 03/29/2021 and pathology from this showed ductal carcinoma in situ, high-grade, 1.3 cm, margins uninvolved by carcinoma, prognostic showed ER positive, 95%, moderate, PR negative.   Ductal carcinoma in situ (DCIS) of right breast  03/14/2021 Initial Diagnosis   Ductal carcinoma in situ (DCIS) of right breast   03/29/2021 Surgery   A. BREAST, RIGHT, LUMPECTOMY:  -  Ductal carcinoma in situ, high grade, 1.3 cm  -  Margins uninvolved by carcinoma (see part B)  -  Previous biopsy site changes  -  See oncology table below   B. BREAST, RIGHT ADDITIONAL MEDIAL MARGIN, EXCISION:  -  No residual carcinoma identified    05/21/2021 - 06/15/2021 Radiation Therapy   Site Technique Total Dose (Gy) Dose per Fx (Gy) Completed Fx Beam Energies  Breast, Right: Breast_R 3D 42.56/42.56 2.66 16/16 6X, 10X  Breast, Right: Breast_R_Bst 3D 8/8 2 4/4 6X, 10X    09/2021 -  Anti-estrogen oral therapy   Anastrozole   12/27/2021 Cancer Staging   Staging form: Breast, AJCC 8th Edition - Clinical: Stage 0 (cTis (DCIS), cN0, cM0) - Signed by Gardenia Phlegm, NP on 12/27/2021 Stage prefix: Initial diagnosis     INTERVAL HISTORY:   She is here for a telephone visit.  Since last visit, she had requested the dental clearance letter from the dentist.  She mentioned that we may have received the dental clearance.  She denies any new dental issues.  She is compliant with anastrozole  REVIEW OF SYSTEMS:  Review of Systems  Constitutional:  Negative for appetite change,  chills, fatigue, fever and unexpected weight change.  HENT:   Negative for hearing loss, lump/mass and trouble swallowing.   Eyes:  Negative for eye problems and icterus.  Respiratory:  Negative for chest tightness, cough and shortness of breath.   Cardiovascular:  Negative for chest pain, leg swelling and palpitations.  Gastrointestinal:  Negative for abdominal distention, abdominal pain, constipation, diarrhea, nausea and vomiting.  Endocrine: Negative for hot flashes.  Genitourinary:  Negative for difficulty urinating.   Musculoskeletal:  Negative for arthralgias.  Skin:  Negative for itching and rash.  Neurological:  Negative for dizziness, extremity weakness, headaches and numbness.  Hematological:  Negative for adenopathy. Does not bruise/bleed easily.  Psychiatric/Behavioral:  Negative for depression. The patient is not nervous/anxious.    Breast: Denies any new nodularity, masses, tenderness, nipple changes, or nipple discharge.      ONCOLOGY TREATMENT TEAM:  1. Surgeon:  Dr. Barry Dienes at [facility] 2. Medical Oncologist: Dr. Chryl Heck  3. Radiation Oncologist: Dr. Lisbeth Renshaw    PAST MEDICAL/SURGICAL HISTORY:  Past Medical History:  Diagnosis Date   Anemia    Anxiety    Asthma    Breast cancer ([place]) 02/23/2021   GERD (gastroesophageal reflux disease)    Glaucoma    Goiter    Hyperlipidemia    Personal history of radiation therapy    Primary hypertension    Seasonal allergic rhinitis    Past Surgical History:  Procedure Laterality Date   BREAST BIOPSY Right 03/01/2021  BREAST LUMPECTOMY Right 03/29/2021   BREAST LUMPECTOMY WITH RADIOACTIVE SEED LOCALIZATION Right 03/29/2021   Procedure: RIGHT BREAST LUMPECTOMY WITH RADIOACTIVE SEED LOCALIZATION;  Surgeon: Stark Klein, MD;  Location: [place];  Service: General;  Laterality: Right;  [place]  2012   MENISCUS REPAIR Right 2001     ALLERGIES:   No Known Allergies   CURRENT MEDICATIONS:  Outpatient Encounter Medications as of 04/25/2022  Medication Sig   albuterol (VENTOLIN HFA) 108 (90 Base) MCG/ACT inhaler Inhale 1-2 puffs into the lungs every 4 (four) hours as needed for wheezing or shortness of breath.   ALPRAZolam (XANAX) 0.25 MG tablet Take 0.25 mg by mouth daily as needed. (Patient not taking: Reported on 12/27/2021)   amLODipine (NORVASC) 5 MG tablet Take 5 mg by mouth daily.   anastrozole (ARIMIDEX) 1 MG tablet TAKE 1 TABLET BY MOUTH EVERY DAY   Cholecalciferol (VITAMIN D3) 50 MCG (2000 UT) CAPS Take 2,000 Units by mouth daily.   fluticasone (FLONASE) 50 MCG/ACT nasal spray Place 1 spray into both nostrils daily. (Patient not taking: Reported on 12/27/2021)   fluticasone-salmeterol (ADVAIR DISKUS) 250-50 MCG/ACT AEPB Inhale 1 puff into the lungs in the morning and at bedtime.   ipratropium (ATROVENT) 0.06 % nasal spray Place 2 sprays into both nostrils 4 (four) times daily. (Patient not taking: Reported on 12/27/2021)   latanoprost (XALATAN) 0.005 % ophthalmic solution Place 1 drop into both eyes at bedtime.   omeprazole (PRILOSEC) 40 MG capsule Take 40 mg by mouth daily.   traZODone (DESYREL) 50 MG tablet Take 50 mg by mouth at bedtime as needed for sleep.   No facility-administered encounter medications on file as of 04/25/2022.     ONCOLOGIC FAMILY HISTORY:  Family History  Problem Relation Age of Onset   Esophageal cancer Mother    Stomach cancer Father    Heart attack Father 2   Heart attack Brother 69   Prostate cancer Paternal Grandfather      SOCIAL HISTORY:  Social History   Socioeconomic History   Marital status: Married    Spouse name: Not on file   Number of children: Not on file   Years of education: Not on file   Highest education level: Not on file  Occupational History   Not on file  Tobacco Use   Smoking status: Never    Passive exposure: Never   Smokeless tobacco: Never  Substance and  Sexual Activity   Alcohol use: Yes    Comment: Social   Drug use: Not on file   Sexual activity: Yes  Other Topics Concern   Not on file  Social History Narrative   Lives at home with husband.     Social Determinants of Health   Financial Resource Strain: Not on file  Food Insecurity: Not on file  Transportation Needs: Not on file  Physical Activity: Not on file  Stress: Not on file  Social Connections: Not on file  Intimate Partner Violence: Not on file   OBSERVATIONS/OBJECTIVE:   There were no vitals taken for this visit. GENERAL: Patient is a well appearing female in no acute distress Bilateral breasts inspected.  No palpable masses or regional adenopathy.   LABORATORY DATA:  None for this visit.  DIAGNOSTIC IMAGING:  None for this visit.    ASSESSMENT AND PLAN:   Ms.. Pala is a pleasant 57 y.o. female with Stage 0 right breast DCIS, ER+/PR+/HER2-, diagnosed  in 02/2021, treated with lumpectomy, adjuvant radiation therapy, and anti-estrogen therapy with Anastrozole beginning in 09/2021.  She is continuing anastrozole at this time.  Given her osteoporosis, we have requested her dental clearance form for bisphosphonates.  Dental clearance form received by the dentist.  We have previously discussed about risk of osteonecrosis of the jaw.  I will order Zometa every 6 months for management of osteoporosis since she is also on anastrozole. Last MRI in October 2023 with no concerns for breast malignancy.  I connected with  Alpha Gula on 04/26/22 by a telephone application and verified that I am speaking with the correct person using two identifiers.   I discussed the limitations of evaluation and management by telemedicine. The patient expressed understanding and agreed to proceed.  Total time spent: 8 min *Total Encounter Time as defined by the Centers for Medicare and Medicaid Services includes, in addition to the face-to-face time of a patient visit (documented in the  note above) non-face-to-face time: obtaining and reviewing outside history, ordering and reviewing medications, tests or procedures, care coordination (communications with other health care professionals or caregivers) and documentation in the medical record.

## 2022-04-26 ENCOUNTER — Encounter: Payer: Self-pay | Admitting: Hematology and Oncology

## 2022-04-26 DIAGNOSIS — M816 Localized osteoporosis [Lequesne]: Secondary | ICD-10-CM | POA: Insufficient documentation

## 2022-04-26 NOTE — Addendum Note (Signed)
Addended by: Adaline Sill on: 04/26/2022 08:22 AM   Modules accepted: Orders

## 2022-05-03 ENCOUNTER — Encounter: Payer: Self-pay | Admitting: Hematology and Oncology

## 2022-05-07 ENCOUNTER — Telehealth: Payer: Self-pay

## 2022-05-07 ENCOUNTER — Inpatient Hospital Stay: Payer: 59

## 2022-05-07 ENCOUNTER — Other Ambulatory Visit: Payer: Self-pay

## 2022-05-07 VITALS — BP 130/79 | HR 78 | Temp 98.0°F | Resp 17

## 2022-05-07 DIAGNOSIS — D0511 Intraductal carcinoma in situ of right breast: Secondary | ICD-10-CM | POA: Diagnosis present

## 2022-05-07 DIAGNOSIS — Z923 Personal history of irradiation: Secondary | ICD-10-CM | POA: Diagnosis not present

## 2022-05-07 DIAGNOSIS — Z79811 Long term (current) use of aromatase inhibitors: Secondary | ICD-10-CM | POA: Diagnosis not present

## 2022-05-07 DIAGNOSIS — M816 Localized osteoporosis [Lequesne]: Secondary | ICD-10-CM

## 2022-05-07 DIAGNOSIS — Z800 Family history of malignant neoplasm of digestive organs: Secondary | ICD-10-CM | POA: Diagnosis not present

## 2022-05-07 DIAGNOSIS — M810 Age-related osteoporosis without current pathological fracture: Secondary | ICD-10-CM | POA: Diagnosis not present

## 2022-05-07 DIAGNOSIS — Z8042 Family history of malignant neoplasm of prostate: Secondary | ICD-10-CM | POA: Diagnosis not present

## 2022-05-07 LAB — CBC WITH DIFFERENTIAL/PLATELET
Abs Immature Granulocytes: 0 10*3/uL (ref 0.00–0.07)
Basophils Absolute: 0 10*3/uL (ref 0.0–0.1)
Basophils Relative: 0 %
Eosinophils Absolute: 0.1 10*3/uL (ref 0.0–0.5)
Eosinophils Relative: 2 %
HCT: 44.8 % (ref 36.0–46.0)
Hemoglobin: 14.2 g/dL (ref 12.0–15.0)
Immature Granulocytes: 0 %
Lymphocytes Relative: 29 %
Lymphs Abs: 0.9 10*3/uL (ref 0.7–4.0)
MCH: 24.3 pg — ABNORMAL LOW (ref 26.0–34.0)
MCHC: 31.7 g/dL (ref 30.0–36.0)
MCV: 76.6 fL — ABNORMAL LOW (ref 80.0–100.0)
Monocytes Absolute: 0.3 10*3/uL (ref 0.1–1.0)
Monocytes Relative: 10 %
Neutro Abs: 1.8 10*3/uL (ref 1.7–7.7)
Neutrophils Relative %: 59 %
Platelets: 195 10*3/uL (ref 150–400)
RBC: 5.85 MIL/uL — ABNORMAL HIGH (ref 3.87–5.11)
RDW: 13.8 % (ref 11.5–15.5)
WBC: 3.1 10*3/uL — ABNORMAL LOW (ref 4.0–10.5)
nRBC: 0 % (ref 0.0–0.2)

## 2022-05-07 LAB — COMPREHENSIVE METABOLIC PANEL
ALT: 11 U/L (ref 0–44)
AST: 14 U/L — ABNORMAL LOW (ref 15–41)
Albumin: 4.2 g/dL (ref 3.5–5.0)
Alkaline Phosphatase: 126 U/L (ref 38–126)
Anion gap: 8 (ref 5–15)
BUN: 19 mg/dL (ref 6–20)
CO2: 33 mmol/L — ABNORMAL HIGH (ref 22–32)
Calcium: 10.1 mg/dL (ref 8.9–10.3)
Chloride: 101 mmol/L (ref 98–111)
Creatinine, Ser: 1.23 mg/dL — ABNORMAL HIGH (ref 0.44–1.00)
GFR, Estimated: 52 mL/min — ABNORMAL LOW (ref >60)
Glucose, Bld: 83 mg/dL (ref 70–99)
Potassium: 3 mmol/L — ABNORMAL LOW (ref 3.5–5.1)
Sodium: 142 mmol/L (ref 135–145)
Total Bilirubin: 0.4 mg/dL (ref 0.3–1.2)
Total Protein: 7.4 g/dL (ref 6.5–8.1)

## 2022-05-07 MED ORDER — SODIUM CHLORIDE 0.9 % IV SOLN: Freq: Once | INTRAVENOUS | Status: AC

## 2022-05-07 MED ORDER — ZOLEDRONIC ACID 4 MG/100ML IV SOLN
4.0000 mg | Freq: Once | INTRAVENOUS | Status: AC
  Administered 2022-05-07: 4 mg via INTRAVENOUS
  Filled 2022-05-07: qty 100

## 2022-05-07 NOTE — Patient Instructions (Signed)

## 2022-05-07 NOTE — Telephone Encounter (Signed)
LVM regarding recent lab results. Patient provided with list of potassium-rich foods to incorporate into her diet. Patient provided with call back number should she have any additional questions or concerns.

## 2022-05-07 NOTE — Telephone Encounter (Signed)
-----  Message from Benay Pike, MD sent at 05/07/2022 11:39 AM EST ----- Potassium is slightly low, this seems to be chronic finding. Can you educate her on potassium rich foods.  Thanks

## 2022-05-21 ENCOUNTER — Ambulatory Visit: Payer: 59 | Admitting: Rehabilitation

## 2022-06-27 ENCOUNTER — Ambulatory Visit: Payer: 59 | Admitting: Hematology and Oncology

## 2022-06-27 ENCOUNTER — Other Ambulatory Visit: Payer: 59

## 2022-07-26 ENCOUNTER — Inpatient Hospital Stay: Payer: 59 | Attending: Hematology and Oncology

## 2022-07-26 ENCOUNTER — Encounter: Payer: Self-pay | Admitting: *Deleted

## 2022-07-26 ENCOUNTER — Inpatient Hospital Stay: Payer: 59

## 2022-07-26 VITALS — BP 127/80 | HR 70 | Temp 98.0°F | Resp 18

## 2022-07-26 DIAGNOSIS — D0511 Intraductal carcinoma in situ of right breast: Secondary | ICD-10-CM | POA: Insufficient documentation

## 2022-07-26 DIAGNOSIS — M816 Localized osteoporosis [Lequesne]: Secondary | ICD-10-CM

## 2022-07-26 LAB — COMPREHENSIVE METABOLIC PANEL
ALT: 14 U/L (ref 0–44)
AST: 15 U/L (ref 15–41)
Albumin: 4.6 g/dL (ref 3.5–5.0)
Alkaline Phosphatase: 96 U/L (ref 38–126)
Anion gap: 8 (ref 5–15)
BUN: 14 mg/dL (ref 6–20)
CO2: 33 mmol/L — ABNORMAL HIGH (ref 22–32)
Calcium: 10.1 mg/dL (ref 8.9–10.3)
Chloride: 101 mmol/L (ref 98–111)
Creatinine, Ser: 1.11 mg/dL — ABNORMAL HIGH (ref 0.44–1.00)
GFR, Estimated: 59 mL/min — ABNORMAL LOW (ref >60)
Glucose, Bld: 112 mg/dL — ABNORMAL HIGH (ref 70–99)
Potassium: 3 mmol/L — ABNORMAL LOW (ref 3.5–5.1)
Sodium: 142 mmol/L (ref 135–145)
Total Bilirubin: 0.4 mg/dL (ref 0.3–1.2)
Total Protein: 8.1 g/dL (ref 6.5–8.1)

## 2022-07-26 LAB — CBC WITH DIFFERENTIAL/PLATELET
Abs Immature Granulocytes: 0.01 10*3/uL (ref 0.00–0.07)
Basophils Absolute: 0 10*3/uL (ref 0.0–0.1)
Basophils Relative: 0 %
Eosinophils Absolute: 0.1 10*3/uL (ref 0.0–0.5)
Eosinophils Relative: 2 %
HCT: 46.4 % — ABNORMAL HIGH (ref 36.0–46.0)
Hemoglobin: 14.7 g/dL (ref 12.0–15.0)
Immature Granulocytes: 0 %
Lymphocytes Relative: 35 %
Lymphs Abs: 1.2 10*3/uL (ref 0.7–4.0)
MCH: 24.5 pg — ABNORMAL LOW (ref 26.0–34.0)
MCHC: 31.7 g/dL (ref 30.0–36.0)
MCV: 77.5 fL — ABNORMAL LOW (ref 80.0–100.0)
Monocytes Absolute: 0.3 10*3/uL (ref 0.1–1.0)
Monocytes Relative: 8 %
Neutro Abs: 1.9 10*3/uL (ref 1.7–7.7)
Neutrophils Relative %: 55 %
Platelets: 199 10*3/uL (ref 150–400)
RBC: 5.99 MIL/uL — ABNORMAL HIGH (ref 3.87–5.11)
RDW: 14.5 % (ref 11.5–15.5)
WBC: 3.4 10*3/uL — ABNORMAL LOW (ref 4.0–10.5)
nRBC: 0 % (ref 0.0–0.2)

## 2022-07-26 NOTE — Patient Instructions (Signed)

## 2022-07-26 NOTE — Progress Notes (Signed)
Patient presented to Infusion appointment for Zometa. Last received 05/07/22. Per Al Pimple, MD note on 04/25/2021, "I will order Zometa every 6 months for management of osteoporosis since she is also on anastrozole." Contacted Rona Ravens, RN for confirmation of Zometa frequency. Zometa appointment cancelled for today with plans for next infusion in June, 2024. Patient verbalized understanding and had no further questions. Ambulated independently to lobby.

## 2022-08-09 ENCOUNTER — Encounter: Payer: Self-pay | Admitting: Hematology and Oncology

## 2022-08-12 ENCOUNTER — Inpatient Hospital Stay (HOSPITAL_BASED_OUTPATIENT_CLINIC_OR_DEPARTMENT_OTHER): Payer: 59 | Admitting: Hematology and Oncology

## 2022-08-12 VITALS — BP 141/79 | HR 81 | Temp 97.9°F | Resp 16 | Wt 165.8 lb

## 2022-08-12 DIAGNOSIS — D0511 Intraductal carcinoma in situ of right breast: Secondary | ICD-10-CM

## 2022-08-12 DIAGNOSIS — N951 Menopausal and female climacteric states: Secondary | ICD-10-CM | POA: Diagnosis not present

## 2022-08-12 MED ORDER — VENLAFAXINE HCL ER 75 MG PO CP24: 75.0000 mg | ORAL_CAPSULE | Freq: Every day | ORAL | 1 refills | Status: DC

## 2022-08-12 NOTE — Progress Notes (Signed)
BRIEF ONCOLOGIC HISTORY:  Oncology History Overview Note  Nov 2022: She was found to have screen detected right breast calcifications.Diagnostic imaging confirmed this. There was a 1.4 cm group of coarse calcifications in the UOQ. There was a benign cyst at 1 o'clock 7 mm in greatest dimension. 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  She had a right breast lumpectomy by Dr. Donell Beers on 03/29/2021 and pathology from this showed ductal carcinoma in situ, high-grade, 1.3 cm, margins uninvolved by carcinoma, prognostic showed ER positive, 95%, moderate, PR negative.   Ductal carcinoma in situ (DCIS) of right breast  03/14/2021 Initial Diagnosis   Ductal carcinoma in situ (DCIS) of right breast   03/29/2021 Surgery   A. BREAST, RIGHT, LUMPECTOMY:  -  Ductal carcinoma in situ, high grade, 1.3 cm  -  Margins uninvolved by carcinoma (see part B)  -  Previous biopsy site changes  -  See oncology table below   B. BREAST, RIGHT ADDITIONAL MEDIAL MARGIN, EXCISION:  -  No residual carcinoma identified    05/21/2021 - 06/15/2021 Radiation Therapy   Site Technique Total Dose (Gy) Dose per Fx (Gy) Completed Fx Beam Energies  Breast, Right: Breast_R 3D 42.56/42.56 2.66 16/16 6X, 10X  Breast, Right: Breast_R_Bst 3D 8/8 2 4/4 6X, 10X    09/2021 -  Anti-estrogen oral therapy   Anastrozole   12/27/2021 Cancer Staging   Staging form: Breast, AJCC 8th Edition - Clinical: Stage 0 (cTis (DCIS), cN0, cM0) - Signed by Loa Socks, NP on 12/27/2021 Stage prefix: Initial diagnosis     INTERVAL HISTORY:  Patient is here for follow-up while on anastrozole.  Since her last visit here, she has some hot flashes, arthralgias, some mood swings.  She feels more depressed.  Her primary care physician gave her some citalopram but this is making her too sleepy and she wants to try something different.  She is otherwise very compliant with anastrozole.  She has noticed  some pain in the outer edge of the right breast but wonders if this is musculoskeletal.  Rest of the pertinent 10 point ROS reviewed and negative   ONCOLOGY TREATMENT TEAM:  1. Surgeon:  Dr. Donell Beers at [facility] 2. Medical Oncologist: Dr. Al Pimple  3. Radiation Oncologist: Dr. Mitzi Hansen    PAST MEDICAL/SURGICAL HISTORY:  Past Medical History:  Diagnosis Date   Anemia    Anxiety    Asthma    Breast cancer (HCC) 02/23/2021   GERD (gastroesophageal reflux disease)    Glaucoma    Goiter    Hyperlipidemia    Personal history of radiation therapy    Primary hypertension    Seasonal allergic rhinitis    Past Surgical History:  Procedure Laterality Date   BREAST BIOPSY Right 03/01/2021   BREAST LUMPECTOMY Right 03/29/2021   BREAST LUMPECTOMY WITH RADIOACTIVE SEED LOCALIZATION Right 03/29/2021   Procedure: RIGHT BREAST LUMPECTOMY WITH RADIOACTIVE SEED LOCALIZATION;  Surgeon: Almond Lint, MD;  Location: [facility] OR;  Service: General;  Laterality: Right;  90 MINUTES ROOM 2   CESAREAN SECTION  1994   LAPAROSCOPIC CHOLECYSTECTOMY  2012   MENISCUS REPAIR Right 2001     ALLERGIES:  No Known Allergies   CURRENT MEDICATIONS:  Outpatient Encounter Medications as of 08/12/2022  Medication Sig   albuterol (VENTOLIN HFA) 108 (90 Base) MCG/ACT inhaler Inhale 1-2 puffs into the lungs every 4 (four) hours as needed for wheezing or shortness of breath.   ALPRAZolam (XANAX) 0.25  MG tablet Take 0.25 mg by mouth daily as needed. (Patient not taking: Reported on 12/27/2021)   amLODipine (NORVASC) 5 MG tablet Take 5 mg by mouth daily.   anastrozole (ARIMIDEX) 1 MG tablet TAKE 1 TABLET BY MOUTH EVERY DAY   Cholecalciferol (VITAMIN D3) 50 MCG (2000 UT) CAPS Take 2,000 Units by mouth daily.   fluticasone (FLONASE) 50 MCG/ACT nasal spray Place 1 spray into both nostrils daily. (Patient not taking: Reported on 12/27/2021)   fluticasone-salmeterol (ADVAIR DISKUS) 250-50 MCG/ACT AEPB Inhale 1 puff  into the lungs in the morning and at bedtime.   ipratropium (ATROVENT) 0.06 % nasal spray Place 2 sprays into both nostrils 4 (four) times daily. (Patient not taking: Reported on 12/27/2021)   latanoprost (XALATAN) 0.005 % ophthalmic solution Place 1 drop into both eyes at bedtime.   omeprazole (PRILOSEC) 40 MG capsule Take 40 mg by mouth daily.   traZODone (DESYREL) 50 MG tablet Take 50 mg by mouth at bedtime as needed for sleep.   No facility-administered encounter medications on file as of 08/12/2022.     ONCOLOGIC FAMILY HISTORY:  Family History  Problem Relation Age of Onset   Esophageal cancer Mother    Stomach cancer Father    Heart attack Father 23   Heart attack Brother 64   Prostate cancer Paternal Grandfather      SOCIAL HISTORY:  Social History   Socioeconomic History   Marital status: Married    Spouse name: Not on file   Number of children: Not on file   Years of education: Not on file   Highest education level: Not on file  Occupational History   Not on file  Tobacco Use   Smoking status: Never    Passive exposure: Never   Smokeless tobacco: Never  Substance and Sexual Activity   Alcohol use: Yes    Comment: Social   Drug use: Not on file   Sexual activity: Yes  Other Topics Concern   Not on file  Social History Narrative   Lives at home with husband.     Social Determinants of Health   Financial Resource Strain: Not on file  Food Insecurity: Not on file  Transportation Needs: Not on file  Physical Activity: Not on file  Stress: Not on file  Social Connections: Not on file  Intimate Partner Violence: Not on file   OBSERVATIONS/OBJECTIVE:   BP (!) 141/79 (BP Location: Left Arm, Patient Position: Sitting)   Pulse 81   Temp 97.9 F (36.6 C) (Temporal)   Resp 16   Wt 165 lb 12.8 oz (75.2 kg)   SpO2 100%   BMI 25.97 kg/m  GENERAL: Patient is a well appearing female in no acute distress Bilateral breasts inspected.  No palpable masses or  regional adenopathy.  Some palpable tenderness in the outer edge of the right breast, this area is slightly outside the right breast.  There is some postop changes, no palpable masses or regional adenopathy otherwise in bilateral breast.   LABORATORY DATA:  None for this visit.  DIAGNOSTIC IMAGING:  None for this visit.    ASSESSMENT AND PLAN:   Ms.. Alexandra Archer is a pleasant 57 y.o. female with Stage 0 right breast DCIS, ER+/PR+/HER2-, diagnosed in 02/2021, treated with lumpectomy, adjuvant radiation therapy, and anti-estrogen therapy with Anastrozole beginning in 09/2021.  She is continuing anastrozole at this time.  Given her osteoporosis, we have requested her dental clearance form for bisphosphonates.  Zometa every 6 months for management of  osteoporosis since she is also on anastrozole. Dental clearance form received. Since her last visit here, she complains of hot flashes and arthralgias with anastrozole but is reluctant to change the medication.  She also complains of some depression, citalopram made her too sleepy, she would like to try something different hence have recommended trying Effexor.  She understands the interaction between Effexor and trazodone.  She was recommended to use trazodone sparingly.  Now with regards to the tenderness in the outer edge of the right breast, she was encouraged to contact us in about 2 to 4 weeks if this does not go away and she expressed understanding she is due for mammogram in October.  This has been scheduled. she will otherwise return to clinic in 6 months or sooner as needed.  Total time : 30 min *Total Encounter Time as defined by the Centers for Medicare and Medicaid Services includes, in addition to the face-to-face time of a patient visit (documented in the note above) non-face-to-face time: obtaining and reviewing outside history, ordering and reviewing medications, tests or procedures, care coordination (communications with other health care  professionals or caregivers) and documentation in the medical record.

## 2022-08-13 ENCOUNTER — Telehealth: Payer: Self-pay | Admitting: Licensed Clinical Social Worker

## 2022-08-13 NOTE — Telephone Encounter (Signed)
[facility] Clinical Social Work  Clinical Social Work was referred by medical provider for counseling.  Clinical Social Worker attempted to contact patient by phone to offer support and assess for needs.   No answer. Left VM with direct contact information.     Tawnia Schirm E Wanell Lorenzi, LCSW  Clinical Social Worker [facility]        

## 2022-09-03 ENCOUNTER — Other Ambulatory Visit: Payer: Self-pay | Admitting: Hematology and Oncology

## 2022-09-30 ENCOUNTER — Ambulatory Visit: Payer: 59 | Admitting: Hematology and Oncology

## 2022-10-25 ENCOUNTER — Ambulatory Visit: Payer: 59 | Admitting: Hematology and Oncology

## 2022-10-25 ENCOUNTER — Ambulatory Visit: Payer: 59

## 2022-10-25 ENCOUNTER — Other Ambulatory Visit: Payer: 59

## 2022-10-31 ENCOUNTER — Other Ambulatory Visit: Payer: Self-pay | Admitting: *Deleted

## 2022-10-31 DIAGNOSIS — D0511 Intraductal carcinoma in situ of right breast: Secondary | ICD-10-CM

## 2022-10-31 DIAGNOSIS — Z7983 Long term (current) use of bisphosphonates: Secondary | ICD-10-CM

## 2022-11-01 ENCOUNTER — Telehealth: Payer: Self-pay | Admitting: *Deleted

## 2022-11-01 ENCOUNTER — Inpatient Hospital Stay: Payer: 59 | Attending: Hematology and Oncology

## 2022-11-01 ENCOUNTER — Inpatient Hospital Stay: Payer: 59

## 2022-11-01 ENCOUNTER — Inpatient Hospital Stay (HOSPITAL_BASED_OUTPATIENT_CLINIC_OR_DEPARTMENT_OTHER): Payer: 59 | Admitting: Hematology and Oncology

## 2022-11-01 VITALS — BP 132/71 | HR 76

## 2022-11-01 VITALS — BP 133/89 | HR 88 | Temp 97.9°F | Resp 16 | Wt 167.9 lb

## 2022-11-01 DIAGNOSIS — M810 Age-related osteoporosis without current pathological fracture: Secondary | ICD-10-CM | POA: Diagnosis not present

## 2022-11-01 DIAGNOSIS — D0511 Intraductal carcinoma in situ of right breast: Secondary | ICD-10-CM

## 2022-11-01 DIAGNOSIS — Z79811 Long term (current) use of aromatase inhibitors: Secondary | ICD-10-CM | POA: Insufficient documentation

## 2022-11-01 DIAGNOSIS — J45909 Unspecified asthma, uncomplicated: Secondary | ICD-10-CM | POA: Insufficient documentation

## 2022-11-01 DIAGNOSIS — Z8042 Family history of malignant neoplasm of prostate: Secondary | ICD-10-CM | POA: Insufficient documentation

## 2022-11-01 DIAGNOSIS — Z7951 Long term (current) use of inhaled steroids: Secondary | ICD-10-CM | POA: Insufficient documentation

## 2022-11-01 DIAGNOSIS — Z170 Estrogen receptor positive status [ER+]: Secondary | ICD-10-CM | POA: Insufficient documentation

## 2022-11-01 DIAGNOSIS — Z79899 Other long term (current) drug therapy: Secondary | ICD-10-CM | POA: Insufficient documentation

## 2022-11-01 DIAGNOSIS — M816 Localized osteoporosis [Lequesne]: Secondary | ICD-10-CM

## 2022-11-01 DIAGNOSIS — Z923 Personal history of irradiation: Secondary | ICD-10-CM | POA: Insufficient documentation

## 2022-11-01 DIAGNOSIS — D649 Anemia, unspecified: Secondary | ICD-10-CM | POA: Insufficient documentation

## 2022-11-01 DIAGNOSIS — Z800 Family history of malignant neoplasm of digestive organs: Secondary | ICD-10-CM | POA: Insufficient documentation

## 2022-11-01 DIAGNOSIS — K219 Gastro-esophageal reflux disease without esophagitis: Secondary | ICD-10-CM | POA: Diagnosis not present

## 2022-11-01 DIAGNOSIS — I10 Essential (primary) hypertension: Secondary | ICD-10-CM | POA: Insufficient documentation

## 2022-11-01 DIAGNOSIS — Z7983 Long term (current) use of bisphosphonates: Secondary | ICD-10-CM

## 2022-11-01 DIAGNOSIS — E785 Hyperlipidemia, unspecified: Secondary | ICD-10-CM | POA: Insufficient documentation

## 2022-11-01 LAB — CBC WITH DIFFERENTIAL (CANCER CENTER ONLY)
Abs Immature Granulocytes: 0.01 10*3/uL (ref 0.00–0.07)
Basophils Absolute: 0 10*3/uL (ref 0.0–0.1)
Basophils Relative: 0 %
Eosinophils Absolute: 0 10*3/uL (ref 0.0–0.5)
Eosinophils Relative: 1 %
HCT: 42.7 % (ref 36.0–46.0)
Hemoglobin: 13.9 g/dL (ref 12.0–15.0)
Immature Granulocytes: 0 %
Lymphocytes Relative: 36 %
Lymphs Abs: 1.1 10*3/uL (ref 0.7–4.0)
MCH: 24.8 pg — ABNORMAL LOW (ref 26.0–34.0)
MCHC: 32.6 g/dL (ref 30.0–36.0)
MCV: 76.1 fL — ABNORMAL LOW (ref 80.0–100.0)
Monocytes Absolute: 0.3 10*3/uL (ref 0.1–1.0)
Monocytes Relative: 9 %
Neutro Abs: 1.6 10*3/uL — ABNORMAL LOW (ref 1.7–7.7)
Neutrophils Relative %: 54 %
Platelet Count: 200 10*3/uL (ref 150–400)
RBC: 5.61 MIL/uL — ABNORMAL HIGH (ref 3.87–5.11)
RDW: 14 % (ref 11.5–15.5)
WBC Count: 3 10*3/uL — ABNORMAL LOW (ref 4.0–10.5)
nRBC: 0 % (ref 0.0–0.2)

## 2022-11-01 LAB — CMP (CANCER CENTER ONLY)
ALT: 12 U/L (ref 0–44)
AST: 14 U/L — ABNORMAL LOW (ref 15–41)
Albumin: 4.4 g/dL (ref 3.5–5.0)
Alkaline Phosphatase: 98 U/L (ref 38–126)
Anion gap: 6 (ref 5–15)
BUN: 15 mg/dL (ref 6–20)
CO2: 33 mmol/L — ABNORMAL HIGH (ref 22–32)
Calcium: 9.8 mg/dL (ref 8.9–10.3)
Chloride: 102 mmol/L (ref 98–111)
Creatinine: 1.07 mg/dL — ABNORMAL HIGH (ref 0.44–1.00)
GFR, Estimated: >60 mL/min (ref >60)
Glucose, Bld: 95 mg/dL (ref 70–99)
Potassium: 3.2 mmol/L — ABNORMAL LOW (ref 3.5–5.1)
Sodium: 141 mmol/L (ref 135–145)
Total Bilirubin: 0.3 mg/dL (ref 0.3–1.2)
Total Protein: 7.7 g/dL (ref 6.5–8.1)

## 2022-11-01 MED ORDER — ZOLEDRONIC ACID 4 MG/100ML IV SOLN
4.0000 mg | Freq: Once | INTRAVENOUS | Status: AC
  Administered 2022-11-01: 4 mg via INTRAVENOUS

## 2022-11-01 MED ORDER — SODIUM CHLORIDE 0.9 % IV SOLN: Freq: Once | INTRAVENOUS | Status: AC

## 2022-11-01 NOTE — Telephone Encounter (Signed)
Per MD may treat with noted creatinine of 1.07

## 2022-11-01 NOTE — Patient Instructions (Signed)

## 2022-11-03 ENCOUNTER — Encounter: Payer: Self-pay | Admitting: Hematology and Oncology

## 2022-11-03 NOTE — Progress Notes (Signed)
BRIEF ONCOLOGIC HISTORY:  Oncology History Overview Note  Nov 2022: She was found to have screen detected right breast calcifications.Diagnostic imaging confirmed this. There was a 1.4 cm group of coarse calcifications in the UOQ. There was a benign cyst at 1 o'clock 7 mm in greatest dimension. 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  She had a right breast lumpectomy by Dr. Donell Beers on 03/29/2021 and pathology from this showed ductal carcinoma in situ, high-grade, 1.3 cm, margins uninvolved by carcinoma, prognostic showed ER positive, 95%, moderate, PR negative.   Ductal carcinoma in situ (DCIS) of right breast  03/14/2021 Initial Diagnosis   Ductal carcinoma in situ (DCIS) of right breast   03/29/2021 Surgery   A. BREAST, RIGHT, LUMPECTOMY:  -  Ductal carcinoma in situ, high grade, 1.3 cm  -  Margins uninvolved by carcinoma (see part B)  -  Previous biopsy site changes  -  See oncology table below   B. BREAST, RIGHT ADDITIONAL MEDIAL MARGIN, EXCISION:  -  No residual carcinoma identified    05/21/2021 - 06/15/2021 Radiation Therapy   Site Technique Total Dose (Gy) Dose per Fx (Gy) Completed Fx Beam Energies  Breast, Right: Breast_R 3D 42.56/42.56 2.66 16/16 6X, 10X  Breast, Right: Breast_R_Bst 3D 8/8 2 4/4 6X, 10X     09/2021 -  Anti-estrogen oral therapy   Anastrozole   12/27/2021 Cancer Staging   Staging form: Breast, AJCC 8th Edition - Clinical: Stage 0 (cTis (DCIS), cN0, cM0) - Signed by Alexandra Socks, NP on 12/27/2021 Stage prefix: Initial diagnosis     INTERVAL HISTORY:  Patient is here for follow-up while on anastrozole.  She has been doing much better overall. Mood is better, she is looking forward to her son's wedding, recently had a great trip to [place]. She denies any major breast changes.  Rest of the pertinent 10 point ROS reviewed and negative   ONCOLOGY TREATMENT TEAM:  1. Surgeon:  Dr. Donell Beers at [facility] 2. Medical Oncologist: Dr. Al Pimple  3. Radiation Oncologist: Dr. Mitzi Hansen    PAST MEDICAL/SURGICAL HISTORY:  Past Medical History:  Diagnosis Date   Anemia    Anxiety    Asthma    Breast cancer (HCC) 02/23/2021   GERD (gastroesophageal reflux disease)    Glaucoma    Goiter    Hyperlipidemia    Personal history of radiation therapy    Primary hypertension    Seasonal allergic rhinitis    Past Surgical History:  Procedure Laterality Date   BREAST BIOPSY Right 03/01/2021   BREAST LUMPECTOMY Right 03/29/2021   BREAST LUMPECTOMY WITH RADIOACTIVE SEED LOCALIZATION Right 03/29/2021   Procedure: RIGHT BREAST LUMPECTOMY WITH RADIOACTIVE SEED LOCALIZATION;  Surgeon: Almond Lint, MD;  Location: [facility] OR;  Service: General;  Laterality: Right;  90 MINUTES ROOM 2   CESAREAN SECTION  1994   LAPAROSCOPIC CHOLECYSTECTOMY  2012   MENISCUS REPAIR Right 2001     ALLERGIES:  No Known Allergies   CURRENT MEDICATIONS:  Outpatient Encounter Medications as of 11/01/2022  Medication Sig   albuterol (VENTOLIN HFA) 108 (90 Base) MCG/ACT inhaler Inhale 1-2 puffs into the lungs every 4 (four) hours as needed for wheezing or shortness of breath.   ALPRAZolam (XANAX) 0.25 MG tablet Take 0.25 mg by mouth daily as needed. (Patient not taking: Reported on 12/27/2021)   amLODipine (NORVASC) 5 MG tablet Take 5 mg by mouth daily.   anastrozole (ARIMIDEX) 1 MG tablet TAKE 1  TABLET BY MOUTH EVERY DAY   Cholecalciferol (VITAMIN D3) 50 MCG (2000 UT) CAPS Take 2,000 Units by mouth daily.   fluticasone (FLONASE) 50 MCG/ACT nasal spray Place 1 spray into both nostrils daily. (Patient not taking: Reported on 12/27/2021)   fluticasone-salmeterol (ADVAIR DISKUS) 250-50 MCG/ACT AEPB Inhale 1 puff into the lungs in the morning and at bedtime.   ipratropium (ATROVENT) 0.06 % nasal spray Place 2 sprays into both nostrils 4 (four) times daily. (Patient not taking: Reported on 12/27/2021)   latanoprost (XALATAN)  0.005 % ophthalmic solution Place 1 drop into both eyes at bedtime.   omeprazole (PRILOSEC) 40 MG capsule Take 40 mg by mouth daily.   traZODone (DESYREL) 50 MG tablet Take 50 mg by mouth at bedtime as needed for sleep.   venlafaxine XR (EFFEXOR-XR) 75 MG 24 hr capsule TAKE 1 CAPSULE BY MOUTH DAILY WITH BREAKFAST. (Patient not taking: Reported on 11/01/2022)   [EXPIRED] 0.9 %  sodium chloride infusion    [EXPIRED] Zoledronic Acid (ZOMETA) IVPB 4 mg    No facility-administered encounter medications on file as of 11/01/2022.     ONCOLOGIC FAMILY HISTORY:  Family History  Problem Relation Age of Onset   Esophageal cancer Mother    Stomach cancer Father    Heart attack Father 29   Heart attack Brother 59   Prostate cancer Paternal Grandfather      SOCIAL HISTORY:  Social History   Socioeconomic History   Marital status: Married    Spouse name: Not on file   Number of children: Not on file   Years of education: Not on file   Highest education level: Not on file  Occupational History   Not on file  Tobacco Use   Smoking status: Never    Passive exposure: Never   Smokeless tobacco: Never  Substance and Sexual Activity   Alcohol use: Yes    Comment: Social   Drug use: Not on file   Sexual activity: Yes  Other Topics Concern   Not on file  Social History Narrative   Lives at home with husband.     Social Determinants of Health   Financial Resource Strain: Not on file  Food Insecurity: Not on file  Transportation Needs: Not on file  Physical Activity: Not on file  Stress: Not on file  Social Connections: Not on file  Intimate Partner Violence: Not on file   OBSERVATIONS/OBJECTIVE:   BP 133/89 (BP Location: Left Arm, Patient Position: Sitting)   Pulse 88   Temp 97.9 F (36.6 C) (Tympanic)   Resp 16   Wt 167 lb 14.4 oz (76.2 kg)   SpO2 99%   BMI 26.30 kg/m    Physical Exam Constitutional:      General: She is not in acute distress.    Appearance: Normal  appearance.  Cardiovascular:     Rate and Rhythm: Normal rate and regular rhythm.     Pulses: Normal pulses.     Heart sounds: Normal heart sounds.  Pulmonary:     Effort: Pulmonary effort is normal.     Breath sounds: Normal breath sounds.  Chest:     Comments: Bilateral breasts inspected and palpated. NO palpable masses or regional adenopathy. Musculoskeletal:        General: No swelling. Normal range of motion.     Cervical back: Normal range of motion and neck supple. No rigidity.  Lymphadenopathy:     Cervical: No cervical adenopathy.  Skin:    General: Skin  is warm and dry.  Neurological:     General: No focal deficit present.     Mental Status: She is alert.  Psychiatric:        Mood and Affect: Mood normal.       LABORATORY DATA:  None for this visit.  DIAGNOSTIC IMAGING:  None for this visit.    ASSESSMENT AND PLAN:   Ms.. Alexandra Archer is a pleasant 57 y.o. female with Stage 0 right breast DCIS, ER+/PR+/HER2-, diagnosed in 02/2021, treated with lumpectomy, adjuvant radiation therapy, and anti-estrogen therapy with Anastrozole beginning in 09/2021.  She is continuing anastrozole at this time.  Given her osteoporosis, we have requested her dental clearance form for bisphosphonates.  Zometa every 6 months for management of osteoporosis since she is also on anastrozole. She denies any breast changes, compliant with anastrozole. No new dental concerns. Mammogram scheduled for October. Overall the mood is better, she is able to cope well. Repeat bone density due in Sep 2025. RTC in 6 months  Total time : 30 min *Total Encounter Time as defined by the Centers for Medicare and Medicaid Services includes, in addition to the face-to-face time of a patient visit (documented in the note above) non-face-to-face time: obtaining and reviewing outside history, ordering and reviewing medications, tests or procedures, care coordination (communications with other health care professionals  or caregivers) and documentation in the medical record.

## 2022-11-04 ENCOUNTER — Telehealth: Payer: Self-pay | Admitting: Hematology and Oncology

## 2022-11-04 NOTE — Telephone Encounter (Signed)
Spoke with patient confirming upcoming appointment  

## 2022-11-06 ENCOUNTER — Other Ambulatory Visit: Payer: Self-pay | Admitting: Hematology and Oncology

## 2022-11-19 ENCOUNTER — Encounter: Payer: Self-pay | Admitting: Hematology and Oncology

## 2022-11-20 ENCOUNTER — Encounter: Payer: Self-pay | Admitting: Hematology and Oncology

## 2022-11-29 ENCOUNTER — Ambulatory Visit (INDEPENDENT_AMBULATORY_CARE_PROVIDER_SITE_OTHER): Payer: 59 | Admitting: Podiatry

## 2022-11-29 DIAGNOSIS — M722 Plantar fascial fibromatosis: Secondary | ICD-10-CM | POA: Diagnosis not present

## 2022-11-29 NOTE — Progress Notes (Signed)
Subjective:  Patient ID: Alexandra Archer, female    DOB: April 01, 1966,  MRN: [id number]  Chief Complaint  Patient presents with   Foot Pain    Left heel pain     57 y.o. female presents with the above complaint.  Patient presents with left heel pain that has been going for quite some time is progressive gotten worse worse with ambulation worse with pressure she would like to discuss treatment options for this pain scale 7 out of 10 dull achy in nature.  Hurts with ambulation worse with pressure.   Review of Systems: Negative except as noted in the HPI. Denies N/V/F/Ch.  Past Medical History:  Diagnosis Date   Anemia    Anxiety    Asthma    Breast cancer (HCC) 02/23/2021   GERD (gastroesophageal reflux disease)    Glaucoma    Goiter    Hyperlipidemia    Personal history of radiation therapy    Primary hypertension    Seasonal allergic rhinitis     Current Outpatient Medications:    albuterol (VENTOLIN HFA) 108 (90 Base) MCG/ACT inhaler, Inhale 1-2 puffs into the lungs every 4 (four) hours as needed for wheezing or shortness of breath., Disp: , Rfl:    ALPRAZolam (XANAX) 0.25 MG tablet, Take 0.25 mg by mouth daily as needed. (Patient not taking: Reported on 12/27/2021), Disp: , Rfl:    amLODipine (NORVASC) 5 MG tablet, Take 5 mg by mouth daily., Disp: , Rfl:    anastrozole (ARIMIDEX) 1 MG tablet, TAKE 1 TABLET BY MOUTH EVERY DAY, Disp: 90 tablet, Rfl: 3   Cholecalciferol (VITAMIN D3) 50 MCG (2000 UT) CAPS, Take 2,000 Units by mouth daily., Disp: , Rfl:    fluticasone (FLONASE) 50 MCG/ACT nasal spray, Place 1 spray into both nostrils daily. (Patient not taking: Reported on 12/27/2021), Disp: 16 g, Rfl: 2   fluticasone-salmeterol (ADVAIR DISKUS) 250-50 MCG/ACT AEPB, Inhale 1 puff into the lungs in the morning and at bedtime., Disp: 60 each, Rfl: 5   ipratropium (ATROVENT) 0.06 % nasal spray, Place 2 sprays into both nostrils 4 (four) times daily. (Patient not taking: Reported on  12/27/2021), Disp: 15 mL, Rfl: 1   latanoprost (XALATAN) 0.005 % ophthalmic solution, Place 1 drop into both eyes at bedtime., Disp: , Rfl:    omeprazole (PRILOSEC) 40 MG capsule, Take 40 mg by mouth daily., Disp: , Rfl:    traZODone (DESYREL) 50 MG tablet, Take 50 mg by mouth at bedtime as needed for sleep., Disp: , Rfl:    venlafaxine XR (EFFEXOR-XR) 75 MG 24 hr capsule, TAKE 1 CAPSULE BY MOUTH DAILY WITH BREAKFAST. (Patient not taking: Reported on 11/01/2022), Disp: 90 capsule, Rfl: 1  Social History   Tobacco Use  Smoking Status Never   Passive exposure: Never  Smokeless Tobacco Never    No Known Allergies Objective:  There were no vitals filed for this visit. There is no height or weight on file to calculate BMI. Constitutional Well developed. Well nourished.  Vascular Dorsalis pedis pulses palpable bilaterally. Posterior tibial pulses palpable bilaterally. Capillary refill normal to all digits.  No cyanosis or clubbing noted. Pedal hair growth normal.  Neurologic Normal speech. Oriented to person, place, and time. Epicritic sensation to light touch grossly present bilaterally.  Dermatologic Nails well groomed and normal in appearance. No open wounds. No skin lesions.  Orthopedic: Normal joint ROM without pain or crepitus bilaterally. No visible deformities. Tender to palpation at the calcaneal tuber left. No pain with calcaneal  squeeze left. Ankle ROM diminished range of motion left. Silfverskiold Test: positive left.   Radiographs none  Assessment:   1. Plantar fasciitis of left foot    Plan:  Patient was evaluated and treated and all questions answered.  Plantar Fasciitis, left - XR reviewed as above.  - Educated on icing and stretching. Instructions given.  - Injection delivered to the plantar fascia as below. - DME: Plantar fascial brace dispensed to support the medial longitudinal arch of the foot and offload pressure from the heel and prevent arch collapse  during weightbearing - Pharmacologic management: None  Procedure: Injection Tendon/Ligament Location: Left plantar fascia at the glabrous junction; medial approach. Skin Prep: alcohol Injectate: 0.5 cc 0.5% marcaine plain, 0.5 cc of 1% Lidocaine, 0.5 cc kenalog 10. Disposition: Patient tolerated procedure well. Injection site dressed with a band-aid.  No follow-ups on file.

## 2022-12-27 ENCOUNTER — Ambulatory Visit: Payer: 59 | Admitting: Podiatry

## 2023-01-03 ENCOUNTER — Ambulatory Visit (INDEPENDENT_AMBULATORY_CARE_PROVIDER_SITE_OTHER): Payer: 59 | Admitting: Podiatry

## 2023-01-03 DIAGNOSIS — Q666 Other congenital valgus deformities of feet: Secondary | ICD-10-CM | POA: Diagnosis not present

## 2023-01-03 DIAGNOSIS — M722 Plantar fascial fibromatosis: Secondary | ICD-10-CM

## 2023-01-03 NOTE — Progress Notes (Signed)
Subjective:  Patient ID: Alexandra Archer, female    DOB: 10-24-1965,  MRN: [id number]  Chief Complaint  Patient presents with   Plantar Fasciitis    Pt stated that her left foot is doing better than it was     57 y.o. female presents with the above complaint.  Patient presents for follow-up of left plantar fasciitis.  Patient states she is doing better.  She still some residual pain denies any other acute complaints pain scale 7 out of 10 dull aching nature she would like to discuss next treatment plan including orthotics   Review of Systems: Negative except as noted in the HPI. Denies N/V/F/Ch.  Past Medical History:  Diagnosis Date   Anemia    Anxiety    Asthma    Breast cancer (HCC) 02/23/2021   GERD (gastroesophageal reflux disease)    Glaucoma    Goiter    Hyperlipidemia    Personal history of radiation therapy    Primary hypertension    Seasonal allergic rhinitis     Current Outpatient Medications:    albuterol (VENTOLIN HFA) 108 (90 Base) MCG/ACT inhaler, Inhale 1-2 puffs into the lungs every 4 (four) hours as needed for wheezing or shortness of breath., Disp: , Rfl:    ALPRAZolam (XANAX) 0.25 MG tablet, Take 0.25 mg by mouth daily as needed. (Patient not taking: Reported on 12/27/2021), Disp: , Rfl:    amLODipine (NORVASC) 5 MG tablet, Take 5 mg by mouth daily., Disp: , Rfl:    anastrozole (ARIMIDEX) 1 MG tablet, TAKE 1 TABLET BY MOUTH EVERY DAY, Disp: 90 tablet, Rfl: 3   Cholecalciferol (VITAMIN D3) 50 MCG (2000 UT) CAPS, Take 2,000 Units by mouth daily., Disp: , Rfl:    fluticasone (FLONASE) 50 MCG/ACT nasal spray, Place 1 spray into both nostrils daily. (Patient not taking: Reported on 12/27/2021), Disp: 16 g, Rfl: 2   fluticasone-salmeterol (ADVAIR DISKUS) 250-50 MCG/ACT AEPB, Inhale 1 puff into the lungs in the morning and at bedtime., Disp: 60 each, Rfl: 5   ipratropium (ATROVENT) 0.06 % nasal spray, Place 2 sprays into both nostrils 4 (four) times daily. (Patient  not taking: Reported on 12/27/2021), Disp: 15 mL, Rfl: 1   latanoprost (XALATAN) 0.005 % ophthalmic solution, Place 1 drop into both eyes at bedtime., Disp: , Rfl:    omeprazole (PRILOSEC) 40 MG capsule, Take 40 mg by mouth daily., Disp: , Rfl:    traZODone (DESYREL) 50 MG tablet, Take 50 mg by mouth at bedtime as needed for sleep., Disp: , Rfl:    venlafaxine XR (EFFEXOR-XR) 75 MG 24 hr capsule, TAKE 1 CAPSULE BY MOUTH DAILY WITH BREAKFAST. (Patient not taking: Reported on 11/01/2022), Disp: 90 capsule, Rfl: 1  Social History   Tobacco Use  Smoking Status Never   Passive exposure: Never  Smokeless Tobacco Never    No Known Allergies Objective:  There were no vitals filed for this visit. There is no height or weight on file to calculate BMI. Constitutional Well developed. Well nourished.  Vascular Dorsalis pedis pulses palpable bilaterally. Posterior tibial pulses palpable bilaterally. Capillary refill normal to all digits.  No cyanosis or clubbing noted. Pedal hair growth normal.  Neurologic Normal speech. Oriented to person, place, and time. Epicritic sensation to light touch grossly present bilaterally.  Dermatologic Nails well groomed and normal in appearance. No open wounds. No skin lesions.  Orthopedic: Normal joint ROM without pain or crepitus bilaterally. No visible deformities. Tender to palpation at the calcaneal tuber left.  No pain with calcaneal squeeze left. Ankle ROM diminished range of motion left. Silfverskiold Test: positive left.   Radiographs none  Assessment:   1. Plantar fasciitis of left foot   2. Pes planovalgus     Plan:  Patient was evaluated and treated and all questions answered.  Plantar Fasciitis, left - XR reviewed as above.  - Educated on icing and stretching. Instructions given.  - second Injection delivered to the plantar fascia as below. - DME: Plantar fascial brace dispensed to support the medial longitudinal arch of the foot and  offload pressure from the heel and prevent arch collapse during weightbearing - Pharmacologic management: None  Pes planovalgus -I explained to patient the etiology of pes planovalgus and relationship with Planter fasciitis and various treatment options were discussed.  Given patient foot structure in the setting of Planter fasciitis I believe patient will benefit from custom-made orthotics to help control the hindfoot motion support the arch of the foot and take the stress away from plantar fascial.  Patient agrees with the plan like to proceed with orthotics -Patient was casted for orthotics   Procedure: Injection Tendon/Ligament Location: Left plantar fascia at the glabrous junction; medial approach. Skin Prep: alcohol Injectate: 0.5 cc 0.5% marcaine plain, 0.5 cc of 1% Lidocaine, 0.5 cc kenalog 10. Disposition: Patient tolerated procedure well. Injection site dressed with a band-aid.  No follow-ups on file.

## 2023-01-13 NOTE — Progress Notes (Signed)
Order placed for Orthotics items to be fit when in  [employer], CFo, CFm

## 2023-01-20 ENCOUNTER — Encounter: Payer: Self-pay | Admitting: Hematology and Oncology

## 2023-01-30 ENCOUNTER — Other Ambulatory Visit: Payer: Self-pay | Admitting: Hematology and Oncology

## 2023-01-30 ENCOUNTER — Ambulatory Visit
Admission: RE | Admit: 2023-01-30 | Discharge: 2023-01-30 | Disposition: A | Discharge status: Home / Self Care | Payer: 59 | Source: Ambulatory Visit | Attending: Hematology and Oncology | Admitting: Hematology and Oncology

## 2023-01-30 DIAGNOSIS — D0511 Intraductal carcinoma in situ of right breast: Secondary | ICD-10-CM

## 2023-02-21 ENCOUNTER — Other Ambulatory Visit: Payer: 59

## 2023-02-28 ENCOUNTER — Other Ambulatory Visit: Payer: 59

## 2023-03-03 ENCOUNTER — Ambulatory Visit: Payer: 59

## 2023-03-03 DIAGNOSIS — Q666 Other congenital valgus deformities of feet: Secondary | ICD-10-CM | POA: Diagnosis not present

## 2023-03-03 DIAGNOSIS — M722 Plantar fascial fibromatosis: Secondary | ICD-10-CM | POA: Diagnosis not present

## 2023-03-03 NOTE — Progress Notes (Signed)
Patient presents today to pick up custom molded foot orthotics, diagnosed with PF by Dr. Allena Katz.   Orthotics were dispensed and fit was satisfactory patient was happy with fit and feel we Reviewed instructions for break-in and wear. Written instructions given to patient.  Patient will follow up as needed.  Addison Bailey CPed, CFo, CFm

## 2023-05-01 ENCOUNTER — Telehealth: Payer: Self-pay | Admitting: *Deleted

## 2023-05-01 ENCOUNTER — Other Ambulatory Visit: Payer: Self-pay | Admitting: *Deleted

## 2023-05-01 DIAGNOSIS — D0511 Intraductal carcinoma in situ of right breast: Secondary | ICD-10-CM

## 2023-05-01 NOTE — Telephone Encounter (Signed)
LM to inform patient that she has a lab appt prior to seeing Dr Al Pimple on 1/23. To arrive shortly prior to 0900

## 2023-05-07 ENCOUNTER — Other Ambulatory Visit: Payer: Self-pay

## 2023-05-07 DIAGNOSIS — D0511 Intraductal carcinoma in situ of right breast: Secondary | ICD-10-CM

## 2023-05-08 ENCOUNTER — Encounter: Payer: Self-pay | Admitting: Hematology and Oncology

## 2023-05-08 ENCOUNTER — Inpatient Hospital Stay: Payer: 59

## 2023-05-08 ENCOUNTER — Inpatient Hospital Stay: Payer: 59 | Attending: Hematology and Oncology | Admitting: Hematology and Oncology

## 2023-05-08 VITALS — BP 129/93 | HR 90 | Temp 98.6°F | Resp 16 | Wt 162.6 lb

## 2023-05-08 DIAGNOSIS — Z1721 Progesterone receptor positive status: Secondary | ICD-10-CM | POA: Insufficient documentation

## 2023-05-08 DIAGNOSIS — D0511 Intraductal carcinoma in situ of right breast: Secondary | ICD-10-CM

## 2023-05-08 DIAGNOSIS — Z79811 Long term (current) use of aromatase inhibitors: Secondary | ICD-10-CM | POA: Diagnosis not present

## 2023-05-08 DIAGNOSIS — Z1732 Human epidermal growth factor receptor 2 negative status: Secondary | ICD-10-CM | POA: Insufficient documentation

## 2023-05-08 DIAGNOSIS — Z170 Estrogen receptor positive status [ER+]: Secondary | ICD-10-CM | POA: Insufficient documentation

## 2023-05-08 DIAGNOSIS — M816 Localized osteoporosis [Lequesne]: Secondary | ICD-10-CM

## 2023-05-08 LAB — CBC WITH DIFFERENTIAL (CANCER CENTER ONLY)
Abs Immature Granulocytes: 0 10*3/uL (ref 0.00–0.07)
Basophils Absolute: 0 10*3/uL (ref 0.0–0.1)
Basophils Relative: 0 %
Eosinophils Absolute: 0 10*3/uL (ref 0.0–0.5)
Eosinophils Relative: 1 %
HCT: 45 % (ref 36.0–46.0)
Hemoglobin: 14 g/dL (ref 12.0–15.0)
Immature Granulocytes: 0 %
Lymphocytes Relative: 40 %
Lymphs Abs: 1.2 10*3/uL (ref 0.7–4.0)
MCH: 24.4 pg — ABNORMAL LOW (ref 26.0–34.0)
MCHC: 31.1 g/dL (ref 30.0–36.0)
MCV: 78.4 fL — ABNORMAL LOW (ref 80.0–100.0)
Monocytes Absolute: 0.3 10*3/uL (ref 0.1–1.0)
Monocytes Relative: 9 %
Neutro Abs: 1.5 10*3/uL — ABNORMAL LOW (ref 1.7–7.7)
Neutrophils Relative %: 50 %
Platelet Count: 203 10*3/uL (ref 150–400)
RBC: 5.74 MIL/uL — ABNORMAL HIGH (ref 3.87–5.11)
RDW: 14.1 % (ref 11.5–15.5)
WBC Count: 3 10*3/uL — ABNORMAL LOW (ref 4.0–10.5)
nRBC: 0 % (ref 0.0–0.2)

## 2023-05-08 LAB — COMPREHENSIVE METABOLIC PANEL
ALT: 11 U/L (ref 0–44)
AST: 14 U/L — ABNORMAL LOW (ref 15–41)
Albumin: 4.5 g/dL (ref 3.5–5.0)
Alkaline Phosphatase: 71 U/L (ref 38–126)
Anion gap: 8 (ref 5–15)
BUN: 14 mg/dL (ref 6–20)
CO2: 33 mmol/L — ABNORMAL HIGH (ref 22–32)
Calcium: 10.1 mg/dL (ref 8.9–10.3)
Chloride: 100 mmol/L (ref 98–111)
Creatinine, Ser: 1.26 mg/dL — ABNORMAL HIGH (ref 0.44–1.00)
GFR, Estimated: 50 mL/min — ABNORMAL LOW (ref >60)
Glucose, Bld: 119 mg/dL — ABNORMAL HIGH (ref 70–99)
Potassium: 3.1 mmol/L — ABNORMAL LOW (ref 3.5–5.1)
Sodium: 141 mmol/L (ref 135–145)
Total Bilirubin: 0.4 mg/dL (ref 0.0–1.2)
Total Protein: 7.8 g/dL (ref 6.5–8.1)

## 2023-05-08 MED ORDER — SODIUM CHLORIDE 0.9 % IV SOLN: Freq: Once | INTRAVENOUS | Status: AC

## 2023-05-08 MED ORDER — ZOLEDRONIC ACID 4 MG/100ML IV SOLN
4.0000 mg | Freq: Once | INTRAVENOUS | Status: AC
  Administered 2023-05-08: 4 mg via INTRAVENOUS
  Filled 2023-05-08: qty 100

## 2023-05-08 NOTE — Progress Notes (Signed)
BRIEF ONCOLOGIC HISTORY:  Oncology History Overview Note  Nov 2022: She was found to have screen detected right breast calcifications.Diagnostic imaging confirmed this. There was a 1.4 cm group of coarse calcifications in the UOQ. There was a benign cyst at 1 o'clock 7 mm in greatest dimension. 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  She had a right breast lumpectomy by Dr. Donell Beers on 03/29/2021 and pathology from this showed ductal carcinoma in situ, high-grade, 1.3 cm, margins uninvolved by carcinoma, prognostic showed ER positive, 95%, moderate, PR negative.   Ductal carcinoma in situ (DCIS) of right breast  03/14/2021 Initial Diagnosis   Ductal carcinoma in situ (DCIS) of right breast   03/29/2021 Surgery   A. BREAST, RIGHT, LUMPECTOMY:  -  Ductal carcinoma in situ, high grade, 1.3 cm  -  Margins uninvolved by carcinoma (see part B)  -  Previous biopsy site changes  -  See oncology table below   B. BREAST, RIGHT ADDITIONAL MEDIAL MARGIN, EXCISION:  -  No residual carcinoma identified    05/21/2021 - 06/15/2021 Radiation Therapy   Site Technique Total Dose (Gy) Dose per Fx (Gy) Completed Fx Beam Energies  Breast, Right: Breast_R 3D 42.56/42.56 2.66 16/16 6X, 10X  Breast, Right: Breast_R_Bst 3D 8/8 2 4/4 6X, 10X     09/2021 -  Anti-estrogen oral therapy   Anastrozole   12/27/2021 Cancer Staging   Staging form: Breast, AJCC 8th Edition - Clinical: Stage 0 (cTis (DCIS), cN0, cM0) - Signed by Loa Socks, NP on 12/27/2021 Stage prefix: Initial diagnosis     INTERVAL HISTORY:   The patient, with a history of stage 0 breast cancer, presents with new onset lower back pain. She denies any prior history of back pain. She also reports stiffness in her fingers, particularly noticeable upon waking. She has previously experienced foot pain, diagnosed as fasciitis, which she attributes to a lack of hormones.  The patient is currently on  anastrozole, started in June 2023, for her breast cancer. She expresses dissatisfaction with the medication, citing various side effects, but is hesitant to switch to a different medication due to fear of new or worsening side effects.  Rest of the pertinent 10 point ROS reviewed and negative  ONCOLOGY TREATMENT TEAM:  1. Surgeon:  Dr. Donell Beers at [facility] 2. Medical Oncologist: Dr. Al Pimple  3. Radiation Oncologist: Dr. Mitzi Hansen    PAST MEDICAL/SURGICAL HISTORY:  Past Medical History:  Diagnosis Date   Anemia    Anxiety    Asthma    Breast cancer (HCC) 02/23/2021   GERD (gastroesophageal reflux disease)    Glaucoma    Goiter    Hyperlipidemia    Personal history of radiation therapy    Primary hypertension    Seasonal allergic rhinitis    Past Surgical History:  Procedure Laterality Date   BREAST BIOPSY Right 03/01/2021   BREAST LUMPECTOMY Right 03/29/2021   BREAST LUMPECTOMY WITH RADIOACTIVE SEED LOCALIZATION Right 03/29/2021   Procedure: RIGHT BREAST LUMPECTOMY WITH RADIOACTIVE SEED LOCALIZATION;  Surgeon: Almond Lint, MD;  Location: [facility] OR;  Service: General;  Laterality: Right;  90 MINUTES ROOM 2   CESAREAN SECTION  1994   LAPAROSCOPIC CHOLECYSTECTOMY  2012   MENISCUS REPAIR Right 2001     ALLERGIES:  No Known Allergies   CURRENT MEDICATIONS:  Outpatient Encounter Medications as of 05/08/2023  Medication Sig   albuterol (VENTOLIN HFA) 108 (90 Base) MCG/ACT inhaler Inhale 1-2 puffs into the lungs  every 4 (four) hours as needed for wheezing or shortness of breath.   ALPRAZolam (XANAX) 0.25 MG tablet Take 0.25 mg by mouth daily as needed.   amLODipine (NORVASC) 5 MG tablet Take 5 mg by mouth daily.   anastrozole (ARIMIDEX) 1 MG tablet TAKE 1 TABLET BY MOUTH EVERY DAY   Cholecalciferol (VITAMIN D3) 50 MCG (2000 UT) CAPS Take 2,000 Units by mouth daily.   fluticasone (FLONASE) 50 MCG/ACT nasal spray Place 1 spray into both nostrils daily.    fluticasone-salmeterol (ADVAIR DISKUS) 250-50 MCG/ACT AEPB Inhale 1 puff into the lungs in the morning and at bedtime.   ipratropium (ATROVENT) 0.06 % nasal spray Place 2 sprays into both nostrils 4 (four) times daily.   latanoprost (XALATAN) 0.005 % ophthalmic solution Place 1 drop into both eyes at bedtime.   omeprazole (PRILOSEC) 40 MG capsule Take 40 mg by mouth daily.   traZODone (DESYREL) 50 MG tablet Take 50 mg by mouth at bedtime as needed for sleep.   venlafaxine XR (EFFEXOR-XR) 75 MG 24 hr capsule TAKE 1 CAPSULE BY MOUTH DAILY WITH BREAKFAST.   No facility-administered encounter medications on file as of 05/08/2023.     ONCOLOGIC FAMILY HISTORY:  Family History  Problem Relation Age of Onset   Esophageal cancer Mother    Stomach cancer Father    Heart attack Father 1   Heart attack Brother 68   Prostate cancer Paternal Grandfather      SOCIAL HISTORY:  Social History   Socioeconomic History   Marital status: Married    Spouse name: Not on file   Number of children: Not on file   Years of education: Not on file   Highest education level: Not on file  Occupational History   Not on file  Tobacco Use   Smoking status: Never    Passive exposure: Never   Smokeless tobacco: Never  Substance and Sexual Activity   Alcohol use: Yes    Comment: Social   Drug use: Not on file   Sexual activity: Yes  Other Topics Concern   Not on file  Social History Narrative   Lives at home with husband.     Social Drivers of Corporate investment banker Strain: Not on file  Food Insecurity: Not on file  Transportation Needs: Not on file  Physical Activity: Not on file  Stress: Not on file  Social Connections: Not on file  Intimate Partner Violence: Not on file   OBSERVATIONS/OBJECTIVE:   BP (!) 129/93 (BP Location: Left Arm, Patient Position: Sitting) Comment: Nurse notified  Pulse 90   Temp 98.6 F (37 C) (Temporal)   Resp 16   Wt 162 lb 9.6 oz (73.8 kg)   SpO2 100%    BMI 25.47 kg/m    Physical Exam Constitutional:      General: She is not in acute distress.    Appearance: Normal appearance.  Cardiovascular:     Rate and Rhythm: Normal rate and regular rhythm.     Pulses: Normal pulses.     Heart sounds: Normal heart sounds.  Pulmonary:     Effort: Pulmonary effort is normal.     Breath sounds: Normal breath sounds.  Chest:     Comments: Bilateral breasts inspected and palpated. NO palpable masses or regional adenopathy. Musculoskeletal:        General: No swelling. Normal range of motion.     Cervical back: Normal range of motion and neck supple. No rigidity.  Lymphadenopathy:  Cervical: No cervical adenopathy.  Skin:    General: Skin is warm and dry.  Neurological:     General: No focal deficit present.     Mental Status: She is alert.  Psychiatric:        Mood and Affect: Mood normal.       LABORATORY DATA:  None for this visit.  DIAGNOSTIC IMAGING:  None for this visit.    ASSESSMENT AND PLAN:   Ms.. Hemming is a pleasant 58 y.o. female with Stage 0 right breast DCIS, ER+/PR+/HER2-, diagnosed in 02/2021, treated with lumpectomy, adjuvant radiation therapy, and anti-estrogen therapy with Anastrozole beginning in 09/2021.   Stage 0 Breast Cancer Patient is currently on Anastrozole since June 2023. Reports new onset lower back pain and stiffness in fingers. Discussed the possibility of switching to Exemestane due to side effects, but patient prefers to continue Anastrozole for now. -Continue Anastrozole. -Monitor for worsening of symptoms and consider switching to Exemestane if side effects persist.  General Health Maintenance Recent colonoscopy revealed a benign polyp. Follow-up colonoscopy scheduled in 7 years. -Continue routine health maintenance as planned.  Follow-up Next appointment with primary care provider scheduled for next month. Oncology follow-up planned for winter, and with Dr. Donell Beers in the summer. -Continue  with scheduled follow-ups.  Repeat bone density due in Sep 2025. RTC in 6 months  Total time : 30 min *Total Encounter Time as defined by the Centers for Medicare and Medicaid Services includes, in addition to the face-to-face time of a patient visit (documented in the note above) non-face-to-face time: obtaining and reviewing outside history, ordering and reviewing medications, tests or procedures, care coordination (communications with other health care professionals or caregivers) and documentation in the medical record.

## 2023-05-08 NOTE — Patient Instructions (Signed)

## 2023-07-03 ENCOUNTER — Encounter: Payer: Self-pay | Admitting: Hematology and Oncology

## 2023-08-21 ENCOUNTER — Encounter: Payer: Self-pay | Admitting: Hematology and Oncology

## 2023-08-21 ENCOUNTER — Other Ambulatory Visit: Payer: Self-pay | Admitting: *Deleted

## 2023-08-21 ENCOUNTER — Other Ambulatory Visit: Payer: Self-pay | Admitting: Hematology and Oncology

## 2023-08-21 DIAGNOSIS — Z9889 Other specified postprocedural states: Secondary | ICD-10-CM

## 2023-08-21 DIAGNOSIS — M810 Age-related osteoporosis without current pathological fracture: Secondary | ICD-10-CM

## 2023-08-21 DIAGNOSIS — Z79811 Long term (current) use of aromatase inhibitors: Secondary | ICD-10-CM

## 2023-08-22 ENCOUNTER — Telehealth: Payer: Self-pay | Admitting: Hematology and Oncology

## 2023-08-22 NOTE — Telephone Encounter (Signed)
 Left patient a vm regarding upcoming appointment

## 2023-08-22 NOTE — Telephone Encounter (Signed)
 Rescheduled appointment per the patients request. The patient specifically asked for the week of 7/21. The patient is aware of the changes made.

## 2023-10-21 ENCOUNTER — Ambulatory Visit

## 2023-10-21 ENCOUNTER — Other Ambulatory Visit

## 2023-10-21 ENCOUNTER — Ambulatory Visit: Admitting: Adult Health

## 2023-10-27 ENCOUNTER — Encounter: Payer: Self-pay | Admitting: Hematology and Oncology

## 2023-10-29 ENCOUNTER — Other Ambulatory Visit: Payer: Self-pay | Admitting: Hematology and Oncology

## 2023-10-31 ENCOUNTER — Other Ambulatory Visit: Payer: Self-pay | Admitting: *Deleted

## 2023-10-31 DIAGNOSIS — D0511 Intraductal carcinoma in situ of right breast: Secondary | ICD-10-CM

## 2023-10-31 NOTE — Progress Notes (Signed)
 [facility] Cancer Follow up:    Alys Schuyler HERO, PA [address] [place] [address]   DIAGNOSIS:  Cancer Staging  Ductal carcinoma in situ (DCIS) of right breast Staging form: Breast, AJCC 8th Edition - Clinical: Stage 0 (cTis (DCIS), cN0, cM0) - Signed by Crawford Morna Pickle, NP on 12/27/2021 Stage prefix: Initial diagnosis    SUMMARY OF ONCOLOGIC HISTORY: Oncology History Overview Note  Nov 2022: She was found to have screen detected right breast calcifications.Diagnostic imaging confirmed this. There was a 1.4 cm group of coarse calcifications in the UOQ. There was a benign cyst at 1 o'clock 7 mm in greatest dimension. 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  She had a right breast lumpectomy by Dr. Aron on 03/29/2021 and pathology from this showed ductal carcinoma in situ, high-grade, 1.3 cm, margins uninvolved by carcinoma, prognostic showed ER positive, 95%, moderate, PR negative.   Ductal carcinoma in situ (DCIS) of right breast  03/14/2021 Initial Diagnosis   Ductal carcinoma in situ (DCIS) of right breast   03/29/2021 Surgery   A. BREAST, RIGHT, LUMPECTOMY:  -  Ductal carcinoma in situ, high grade, 1.3 cm  -  Margins uninvolved by carcinoma (see part B)  -  Previous biopsy site changes  -  See oncology table below   B. BREAST, RIGHT ADDITIONAL MEDIAL MARGIN, EXCISION:  -  No residual carcinoma identified    05/21/2021 - 06/15/2021 Radiation Therapy   Site Technique Total Dose (Gy) Dose per Fx (Gy) Completed Fx Beam Energies  Breast, Right: Breast_R 3D 42.56/42.56 2.66 16/16 6X, 10X  Breast, Right: Breast_R_Bst 3D 8/8 2 4/4 6X, 10X     09/2021 -  Anti-estrogen oral therapy   Anastrozole    12/27/2021 Cancer Staging   Staging form: Breast, AJCC 8th Edition - Clinical: Stage 0 (cTis (DCIS), cN0, cM0) - Signed by Crawford Morna Pickle, NP on 12/27/2021 Stage prefix: Initial diagnosis      CURRENT THERAPY: Anastrozole , Zometa   INTERVAL HISTORY:  Discussed the use of AI scribe software for clinical note transcription with the patient, who gave verbal consent to proceed.  History of Present Illness Alexandra Archer is a 58 year old female with right breast ductal carcinoma in situ who presents for follow-up after lumpectomy, radiation, and antiestrogen therapy.  Diagnosed with right breast ductal carcinoma in situ in December 2022, she underwent lumpectomy and adjuvant radiation therapy. She began antiestrogen therapy with anastrozole  in June 2023. The most recent mammogram on January 30, 2023, shows no evidence of malignancy, a decreasing benign seroma at the lumpectomy site, and breast density category C. No new or increasing breast pain is reported.  She has osteoporosis, confirmed by a bone density test in September 2023. She is on Zometa  every six months and reports receiving information stating that her insurance denied the Zometa .  She wants to learn more about this.  She plans a dental procedure post-October and has received prior dental clearance for Zometa .     Patient Active Problem List   Diagnosis Date Noted   Localized osteoporosis without current pathological fracture 04/26/2022   Severe persistent asthma with acute exacerbation 04/13/2021   Ductal carcinoma in situ (DCIS) of right breast 03/14/2021    has no known allergies.  MEDICAL HISTORY: Past Medical History:  Diagnosis Date   Anemia    Anxiety    Asthma    Breast cancer (HCC) 02/23/2021   GERD (gastroesophageal  reflux disease)    Glaucoma    Goiter    Hyperlipidemia    Personal history of radiation therapy    Primary hypertension    Seasonal allergic rhinitis     SURGICAL HISTORY: Past Surgical History:  Procedure Laterality Date   BREAST BIOPSY Right 03/01/2021   BREAST LUMPECTOMY Right 03/29/2021   BREAST LUMPECTOMY WITH RADIOACTIVE SEED LOCALIZATION Right 03/29/2021    Procedure: RIGHT BREAST LUMPECTOMY WITH RADIOACTIVE SEED LOCALIZATION;  Surgeon: Aron Shoulders, MD;  Location: [facility] OR;  Service: General;  Laterality: Right;  90 MINUTES ROOM 2   CESAREAN SECTION  1994   LAPAROSCOPIC CHOLECYSTECTOMY  2012   MENISCUS REPAIR Right 2001    SOCIAL HISTORY: Social History   Socioeconomic History   Marital status: Married    Spouse name: Not on file   Number of children: Not on file   Years of education: Not on file   Highest education level: Not on file  Occupational History   Not on file  Tobacco Use   Smoking status: Never    Passive exposure: Never   Smokeless tobacco: Never  Substance and Sexual Activity   Alcohol use: Yes    Comment: Social   Drug use: Not on file   Sexual activity: Yes  Other Topics Concern   Not on file  Social History Narrative   Lives at home with husband.     Social Drivers of Corporate investment banker Strain: Not on file  Food Insecurity: Not on file  Transportation Needs: Not on file  Physical Activity: Not on file  Stress: Not on file  Social Connections: Not on file  Intimate Partner Violence: Not on file    FAMILY HISTORY: Family History  Problem Relation Age of Onset   Esophageal cancer Mother    Stomach cancer Father    Heart attack Father 18   Heart attack Brother 59   Prostate cancer Paternal Grandfather     Review of Systems  Constitutional:  Negative for appetite change, chills, fatigue, fever and unexpected weight change.  HENT:   Negative for hearing loss, lump/mass and trouble swallowing.   Eyes:  Negative for eye problems and icterus.  Respiratory:  Negative for chest tightness, cough and shortness of breath.   Cardiovascular:  Negative for chest pain, leg swelling and palpitations.  Gastrointestinal:  Negative for abdominal distention, abdominal pain, constipation, diarrhea, nausea and vomiting.  Endocrine: Negative for hot flashes.  Genitourinary:  Negative for difficulty urinating.    Musculoskeletal:  Negative for arthralgias.  Skin:  Negative for itching and rash.  Neurological:  Negative for dizziness, extremity weakness, headaches and numbness.  Hematological:  Negative for adenopathy. Does not bruise/bleed easily.  Psychiatric/Behavioral:  Negative for depression. The patient is not nervous/anxious.       PHYSICAL EXAMINATION    Vitals:   11/03/23 0848  BP: 124/67  Pulse: 67  Resp: 18  Temp: 98 F (36.7 C)  SpO2: 100%    Physical Exam Constitutional:      General: She is not in acute distress.    Appearance: Normal appearance. She is not toxic-appearing.  HENT:     Head: Normocephalic and atraumatic.     Mouth/Throat:     Mouth: Mucous membranes are moist.     Pharynx: Oropharynx is clear. No oropharyngeal exudate or posterior oropharyngeal erythema.  Eyes:     General: No scleral icterus. Cardiovascular:     Rate and Rhythm: Normal rate and regular rhythm.  Pulses: Normal pulses.     Heart sounds: Normal heart sounds.  Pulmonary:     Effort: Pulmonary effort is normal.     Breath sounds: Normal breath sounds.  Chest:     Comments: Right breast s/p lumpectomy and radiation, no sign of local recurrence, left breast benign Abdominal:     General: Abdomen is flat. Bowel sounds are normal. There is no distension.     Palpations: Abdomen is soft.     Tenderness: There is no abdominal tenderness.  Musculoskeletal:        General: No swelling.     Cervical back: Neck supple.  Lymphadenopathy:     Cervical: No cervical adenopathy.     Upper Body:     Right upper body: No supraclavicular or axillary adenopathy.     Left upper body: No supraclavicular or axillary adenopathy.  Skin:    General: Skin is warm and dry.     Findings: No rash.  Neurological:     General: No focal deficit present.     Mental Status: She is alert.  Psychiatric:        Mood and Affect: Mood normal.        Behavior: Behavior normal.     LABORATORY  DATA:  CBC    Component Value Date/Time   WBC 2.8 (L) 11/03/2023 0806   WBC 3.4 (L) 07/26/2022 1447   RBC 5.77 (H) 11/03/2023 0806   HGB 14.0 11/03/2023 0806   HCT 43.5 11/03/2023 0806   PLT 208 11/03/2023 0806   MCV 75.4 (L) 11/03/2023 0806   MCH 24.3 (L) 11/03/2023 0806   MCHC 32.2 11/03/2023 0806   RDW 14.4 11/03/2023 0806   LYMPHSABS 1.1 11/03/2023 0806   MONOABS 0.3 11/03/2023 0806   EOSABS 0.0 11/03/2023 0806   BASOSABS 0.0 11/03/2023 0806    CMP     Component Value Date/Time   NA 143 11/03/2023 0806   K 3.0 (L) 11/03/2023 0806   CL 104 11/03/2023 0806   CO2 32 11/03/2023 0806   GLUCOSE 108 (H) 11/03/2023 0806   BUN 16 11/03/2023 0806   CREATININE 1.26 (H) 11/03/2023 0806   CALCIUM 9.6 11/03/2023 0806   PROT 8.1 11/03/2023 0806   ALBUMIN 4.4 11/03/2023 0806   AST 16 11/03/2023 0806   ALT 12 11/03/2023 0806   ALKPHOS 73 11/03/2023 0806   BILITOT 0.5 11/03/2023 0806   GFRNONAA 50 (L) 11/03/2023 0806     ASSESSMENT and THERAPY PLAN:   Assessment and Plan Assessment & Plan Ductal Carcinoma In Situ (DCIS) of right breast DCIS treated with lumpectomy, radiation, and anastrozole . Recent mammogram negative for malignancy, benign seroma noted. - Continue anastrozole  therapy. - Schedule regular mammograms.  Osteoporosis Osteoporosis managed with biannual Zometa . Dental clearance discussed. Insurance questionably denied. Jon Shope in our prior authorization team was contacted and could not verify that Zometa  was approved for the patient to receive and whether there was a denial with one of her insurances.  We are awaiting follow up from our prior authorization team about this.   - Proceed with repeat bone density test for January 2026. - Proceed with Zometa  once insurance approval is worked out.   Elevated kidney function Elevated kidney function noted. Emphasized hydration to prevent dehydration. - Follow up with primary care for blood pressure management. -  Ensure adequate fluid intake.  Hypokalemia Hypokalemia possibly linked to fluid intake and antihypertensive medication. - Increase potassium-rich food intake. - Send lab results to primary care  for evaluation.  RTC in 6 months for labs, f/u, and next Zometa .     All questions were answered. The patient knows to call the clinic with any problems, questions or concerns. We can certainly see the patient much sooner if necessary.  Total encounter time:30 minutes*in face-to-face visit time, chart review, lab review, care coordination, order entry, and documentation of the encounter time.    Morna Kendall, NP 11/03/23 12:55 PM Medical Oncology and Hematology [facility] [address] [place], [place] [address] Tel. [phone]    Fax. [phone]  *Total Encounter Time as defined by the Centers for Medicare and Medicaid Services includes, in addition to the face-to-face time of a patient visit (documented in the note above) non-face-to-face time: obtaining and reviewing outside history, ordering and reviewing medications, tests or procedures, care coordination (communications with other health care professionals or caregivers) and documentation in the medical record.

## 2023-11-03 ENCOUNTER — Inpatient Hospital Stay: Attending: Adult Health

## 2023-11-03 ENCOUNTER — Inpatient Hospital Stay

## 2023-11-03 ENCOUNTER — Encounter: Payer: Self-pay | Admitting: Adult Health

## 2023-11-03 ENCOUNTER — Inpatient Hospital Stay (HOSPITAL_BASED_OUTPATIENT_CLINIC_OR_DEPARTMENT_OTHER): Admitting: Adult Health

## 2023-11-03 ENCOUNTER — Encounter: Payer: Self-pay | Admitting: Hematology and Oncology

## 2023-11-03 VITALS — BP 124/67 | HR 67 | Temp 98.0°F | Resp 18 | Ht 67.0 in | Wt 159.3 lb

## 2023-11-03 DIAGNOSIS — M810 Age-related osteoporosis without current pathological fracture: Secondary | ICD-10-CM | POA: Diagnosis not present

## 2023-11-03 DIAGNOSIS — D0511 Intraductal carcinoma in situ of right breast: Secondary | ICD-10-CM | POA: Insufficient documentation

## 2023-11-03 DIAGNOSIS — Z79811 Long term (current) use of aromatase inhibitors: Secondary | ICD-10-CM | POA: Insufficient documentation

## 2023-11-03 DIAGNOSIS — Z1722 Progesterone receptor negative status: Secondary | ICD-10-CM | POA: Diagnosis not present

## 2023-11-03 DIAGNOSIS — E876 Hypokalemia: Secondary | ICD-10-CM | POA: Diagnosis not present

## 2023-11-03 DIAGNOSIS — M816 Localized osteoporosis [Lequesne]: Secondary | ICD-10-CM | POA: Diagnosis not present

## 2023-11-03 DIAGNOSIS — Z170 Estrogen receptor positive status [ER+]: Secondary | ICD-10-CM | POA: Insufficient documentation

## 2023-11-03 DIAGNOSIS — Z800 Family history of malignant neoplasm of digestive organs: Secondary | ICD-10-CM | POA: Insufficient documentation

## 2023-11-03 DIAGNOSIS — Z923 Personal history of irradiation: Secondary | ICD-10-CM | POA: Diagnosis not present

## 2023-11-03 DIAGNOSIS — Z8042 Family history of malignant neoplasm of prostate: Secondary | ICD-10-CM | POA: Insufficient documentation

## 2023-11-03 LAB — CBC WITH DIFFERENTIAL (CANCER CENTER ONLY)
Abs Immature Granulocytes: 0 K/uL (ref 0.00–0.07)
Basophils Absolute: 0 K/uL (ref 0.0–0.1)
Basophils Relative: 0 %
Eosinophils Absolute: 0 K/uL (ref 0.0–0.5)
Eosinophils Relative: 1 %
HCT: 43.5 % (ref 36.0–46.0)
Hemoglobin: 14 g/dL (ref 12.0–15.0)
Immature Granulocytes: 0 %
Lymphocytes Relative: 39 %
Lymphs Abs: 1.1 K/uL (ref 0.7–4.0)
MCH: 24.3 pg — ABNORMAL LOW (ref 26.0–34.0)
MCHC: 32.2 g/dL (ref 30.0–36.0)
MCV: 75.4 fL — ABNORMAL LOW (ref 80.0–100.0)
Monocytes Absolute: 0.3 K/uL (ref 0.1–1.0)
Monocytes Relative: 9 %
Neutro Abs: 1.5 K/uL — ABNORMAL LOW (ref 1.7–7.7)
Neutrophils Relative %: 51 %
Platelet Count: 208 K/uL (ref 150–400)
RBC: 5.77 MIL/uL — ABNORMAL HIGH (ref 3.87–5.11)
RDW: 14.4 % (ref 11.5–15.5)
WBC Count: 2.8 K/uL — ABNORMAL LOW (ref 4.0–10.5)
nRBC: 0 % (ref 0.0–0.2)

## 2023-11-03 LAB — CMP (CANCER CENTER ONLY)
ALT: 12 U/L (ref 0–44)
AST: 16 U/L (ref 15–41)
Albumin: 4.4 g/dL (ref 3.5–5.0)
Alkaline Phosphatase: 73 U/L (ref 38–126)
Anion gap: 7 (ref 5–15)
BUN: 16 mg/dL (ref 6–20)
CO2: 32 mmol/L (ref 22–32)
Calcium: 9.6 mg/dL (ref 8.9–10.3)
Chloride: 104 mmol/L (ref 98–111)
Creatinine: 1.26 mg/dL — ABNORMAL HIGH (ref 0.44–1.00)
GFR, Estimated: 50 mL/min — ABNORMAL LOW (ref >60)
Glucose, Bld: 108 mg/dL — ABNORMAL HIGH (ref 70–99)
Potassium: 3 mmol/L — ABNORMAL LOW (ref 3.5–5.1)
Sodium: 143 mmol/L (ref 135–145)
Total Bilirubin: 0.5 mg/dL (ref 0.0–1.2)
Total Protein: 8.1 g/dL (ref 6.5–8.1)

## 2023-11-17 ENCOUNTER — Inpatient Hospital Stay: Attending: Adult Health

## 2023-11-17 VITALS — BP 121/76 | HR 80 | Temp 98.8°F | Resp 17

## 2023-11-17 DIAGNOSIS — D0511 Intraductal carcinoma in situ of right breast: Secondary | ICD-10-CM | POA: Diagnosis not present

## 2023-11-17 DIAGNOSIS — Z79899 Other long term (current) drug therapy: Secondary | ICD-10-CM | POA: Insufficient documentation

## 2023-11-17 DIAGNOSIS — M810 Age-related osteoporosis without current pathological fracture: Secondary | ICD-10-CM | POA: Diagnosis present

## 2023-11-17 DIAGNOSIS — M816 Localized osteoporosis [Lequesne]: Secondary | ICD-10-CM

## 2023-11-17 MED ORDER — ZOLEDRONIC ACID 4 MG/100ML IV SOLN
4.0000 mg | Freq: Once | INTRAVENOUS | Status: AC
Start: 2023-11-17 — End: 2023-11-17
  Administered 2023-11-17: 4 mg via INTRAVENOUS
  Filled 2023-11-17: qty 100

## 2023-11-17 MED ORDER — SODIUM CHLORIDE 0.9 % IV SOLN: Freq: Once | INTRAVENOUS | Status: AC

## 2023-11-17 NOTE — Patient Instructions (Signed)

## 2024-02-03 ENCOUNTER — Ambulatory Visit
Admission: RE | Admit: 2024-02-03 | Discharge: 2024-02-03 | Disposition: A | Source: Ambulatory Visit | Attending: Hematology and Oncology

## 2024-02-03 DIAGNOSIS — Z9889 Other specified postprocedural states: Secondary | ICD-10-CM

## 2024-03-31 ENCOUNTER — Telehealth: Payer: Self-pay | Admitting: Hematology and Oncology

## 2024-03-31 NOTE — Telephone Encounter (Signed)
 I left voicemail for patient regarding 05/2024 appointments.

## 2024-04-14 ENCOUNTER — Encounter: Payer: Self-pay | Admitting: Adult Health

## 2024-05-07 ENCOUNTER — Other Ambulatory Visit

## 2024-05-10 ENCOUNTER — Inpatient Hospital Stay (HOSPITAL_BASED_OUTPATIENT_CLINIC_OR_DEPARTMENT_OTHER): Admission: RE | Admit: 2024-05-10 | Source: Ambulatory Visit

## 2024-05-11 ENCOUNTER — Ambulatory Visit (HOSPITAL_BASED_OUTPATIENT_CLINIC_OR_DEPARTMENT_OTHER)
Admission: RE | Admit: 2024-05-11 | Discharge: 2024-05-11 | Disposition: A | Discharge status: Home / Self Care | Source: Ambulatory Visit | Attending: Adult Health | Admitting: Adult Health

## 2024-05-11 DIAGNOSIS — Z79811 Long term (current) use of aromatase inhibitors: Secondary | ICD-10-CM | POA: Insufficient documentation

## 2024-05-11 DIAGNOSIS — M810 Age-related osteoporosis without current pathological fracture: Secondary | ICD-10-CM | POA: Insufficient documentation

## 2024-05-12 ENCOUNTER — Ambulatory Visit: Payer: Self-pay

## 2024-05-13 ENCOUNTER — Other Ambulatory Visit: Payer: Self-pay

## 2024-06-07 ENCOUNTER — Ambulatory Visit: Admitting: Hematology and Oncology

## 2024-06-07 ENCOUNTER — Ambulatory Visit

## 2024-06-07 ENCOUNTER — Other Ambulatory Visit

## 2024-06-10 ENCOUNTER — Inpatient Hospital Stay: Admitting: Hematology and Oncology

## 2024-06-10 ENCOUNTER — Inpatient Hospital Stay
